# Patient Record
Sex: Male | Born: 1985 | Race: White | Hispanic: No | Marital: Married | Smoking: Former smoker
Health system: Southern US, Community
[De-identification: ages and names within clinical notes are randomized; demographics above are authoritative.]

## PROBLEM LIST (undated history)

## (undated) DIAGNOSIS — F411 Generalized anxiety disorder: Secondary | ICD-10-CM

## (undated) DIAGNOSIS — A048 Other specified bacterial intestinal infections: Secondary | ICD-10-CM

## (undated) DIAGNOSIS — I82409 Acute embolism and thrombosis of unspecified deep veins of unspecified lower extremity: Secondary | ICD-10-CM

## (undated) DIAGNOSIS — R7989 Other specified abnormal findings of blood chemistry: Secondary | ICD-10-CM

## (undated) DIAGNOSIS — K219 Gastro-esophageal reflux disease without esophagitis: Secondary | ICD-10-CM

## (undated) DIAGNOSIS — D751 Secondary polycythemia: Secondary | ICD-10-CM

## (undated) DIAGNOSIS — I2699 Other pulmonary embolism without acute cor pulmonale: Secondary | ICD-10-CM

## (undated) DIAGNOSIS — I10 Essential (primary) hypertension: Secondary | ICD-10-CM

## (undated) HISTORY — DX: Gastro-esophageal reflux disease without esophagitis: K21.9

## (undated) HISTORY — DX: Secondary polycythemia: D75.1

## (undated) HISTORY — DX: Other pulmonary embolism without acute cor pulmonale: I26.99

## (undated) HISTORY — DX: Essential (primary) hypertension: I10

## (undated) HISTORY — DX: Other specified bacterial intestinal infections: A04.8

## (undated) HISTORY — PX: TONSILLECTOMY: SUR1361

## (undated) HISTORY — DX: Other specified abnormal findings of blood chemistry: R79.89

## (undated) HISTORY — DX: Generalized anxiety disorder: F41.1

## (undated) HISTORY — PX: KNEE SURGERY: SHX244

## (undated) HISTORY — DX: Acute embolism and thrombosis of unspecified deep veins of unspecified lower extremity: I82.409

## (undated) HISTORY — PX: SHOULDER SURGERY: SHX246

---
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
## 2004-07-19 ENCOUNTER — Ambulatory Visit: Payer: Self-pay | Admitting: Family Medicine

## 2004-10-19 ENCOUNTER — Ambulatory Visit: Payer: Self-pay | Admitting: Family Medicine

## 2005-05-01 ENCOUNTER — Ambulatory Visit: Payer: Self-pay | Admitting: Family Medicine

## 2005-05-31 ENCOUNTER — Encounter: Admission: RE | Admit: 2005-05-31 | Discharge: 2005-08-29 | Payer: Self-pay | Admitting: Specialist

## 2005-08-20 ENCOUNTER — Ambulatory Visit: Payer: Self-pay | Admitting: Family Medicine

## 2006-03-15 ENCOUNTER — Ambulatory Visit: Payer: Self-pay | Admitting: Family Medicine

## 2006-07-01 ENCOUNTER — Ambulatory Visit: Payer: Self-pay | Admitting: Internal Medicine

## 2006-09-24 ENCOUNTER — Encounter: Payer: Self-pay | Admitting: Family Medicine

## 2006-09-24 DIAGNOSIS — G43009 Migraine without aura, not intractable, without status migrainosus: Secondary | ICD-10-CM | POA: Insufficient documentation

## 2006-09-25 ENCOUNTER — Ambulatory Visit: Payer: Self-pay | Admitting: Family Medicine

## 2006-09-25 DIAGNOSIS — R635 Abnormal weight gain: Secondary | ICD-10-CM | POA: Insufficient documentation

## 2006-09-25 DIAGNOSIS — I10 Essential (primary) hypertension: Secondary | ICD-10-CM

## 2006-10-25 ENCOUNTER — Telehealth: Payer: Self-pay | Admitting: Family Medicine

## 2007-09-16 ENCOUNTER — Ambulatory Visit: Payer: Self-pay | Admitting: Family Medicine

## 2007-09-19 ENCOUNTER — Encounter (INDEPENDENT_AMBULATORY_CARE_PROVIDER_SITE_OTHER): Payer: Self-pay | Admitting: *Deleted

## 2007-09-19 ENCOUNTER — Telehealth: Payer: Self-pay | Admitting: Family Medicine

## 2007-09-23 ENCOUNTER — Ambulatory Visit: Payer: Self-pay | Admitting: Orthopaedic Surgery

## 2008-02-17 ENCOUNTER — Ambulatory Visit: Payer: Self-pay | Admitting: Family Medicine

## 2008-02-17 DIAGNOSIS — K5289 Other specified noninfective gastroenteritis and colitis: Secondary | ICD-10-CM

## 2009-07-14 ENCOUNTER — Emergency Department: Payer: Self-pay | Admitting: Emergency Medicine

## 2009-11-11 ENCOUNTER — Ambulatory Visit: Payer: Self-pay | Admitting: General Practice

## 2009-11-21 ENCOUNTER — Encounter (INDEPENDENT_AMBULATORY_CARE_PROVIDER_SITE_OTHER): Payer: Self-pay | Admitting: *Deleted

## 2010-05-16 NOTE — Letter (Signed)
Summary: Nadara Eaton letter  [place] [facility]  [address] [place], [place] [address]   Phone: [phone]  Fax: [phone]       11/21/2009 MRN: [id number]  JAMONT MELLIN [address] [place], [place]  [address]  Dear Mr. Tarri Aberneth[facility], and [facility] announce the retirement of Arta Silence, M.D., from full-time practice at the [facility] office effective October 13, 2009 and his plans of returning part-time.  It is important to Dr. Hetty Ely and to our practice that you understand that [facility] has seven physicians in our office for your health care needs.  We will continue to offer the same exceptional care that you have today.    Dr. Hetty Ely has spoken to many of you about his plans for retirement and returning part-time in the fall.   We will continue to work with you through the transition to schedule appointments for you in the office and meet the high standards that [place] is committed to.   Again, it is with great pleasure that we share the news that Dr. Hetty Ely will return to [facility] in October of 2011 with a reduced schedule.    If you have any questions, or would like to request an appointment with one of our physicians, please call us at [phone] and press the option for Scheduling an appointment.  We take pleasure in providing you with excellent patient care and look forward to seeing you at your next office visit.  Our [facility] Physicians are:  Tillman Abide, M.D. [name], M.D. Roxy Manns, M.D. Kerby Nora, M.D. Hannah Beat, M.D. Ruthe Mannan, M.D. We proudly welcomed Raechel Ache, M.D. and Eustaquio Boyden, M.D. to the practice in July/August 2011.  Sincerely,  [facility]

## 2010-09-01 NOTE — Discharge Summary (Signed)
NAMEREIN, POPOV                         ACCOUNT NO.:  [id number]   MEDICAL RECORD NO.:  [id number]                   PATIENT TYPE:  INP   LOCATION:  6126                                 FACILITY:  [facility]   PHYSICIAN:  Rene Paci, M.D. [facility]          DATE OF BIRTH:  1985-09-03   DATE OF ADMISSION:  06/07/2003  DATE OF DISCHARGE:  06/08/2003                                 DISCHARGE SUMMARY   DISCHARGE DIAGNOSES:  1. Acute infectious mononucleosis.  2. Tonsillar hypertrophy.  3. Partial obstruction.  4. Mildly elevated transaminases.  5. Dehydration.   BRIEF ADMISSION HISTORY:  Mr. Piechota is an 25 year old white male who  developed upper respiratory infections over the past week.  He was seen in  the office on June 04, 2003 with severe back pain.  Over the past few  days, he has had worsening tonsillary swelling and pharyngeal pain.  His  pharyngeal pain worsened over the weekend.  He eventually went to the ER,  and was given IV fluids and IV morphine.  Laboratories revealed a positive  monospot with right shift.  The patient was unable to eat or drink secondary  to throat pain and swelling.   PAST MEDICAL HISTORY:  1. Status post appendectomy in August of 2003.  2. History of prostatitis.  3. History of wisdom teeth extractions.  4. History of multiple episodes of tonsillar swelling and pharyngitis.   HOSPITAL COURSE:  #1.  ENT.  The patient presented with acute infectious  mononucleosis with severe pharyngitis.  The patient was admitted for IV  hydration and steroids.  We also asked for ENT to see the patient.  The  patient was seen in consultation by Dr. Lazarus Salines, who concurred that the  patient had an acute infectious mononucleosis with tonsillary hypertrophy  and partial obstruction.  He saw no evidence of peritonsillar abscess or  bacterial infection.  He saw no indication for antibiotics.  The patient  received 3 doses of IV Solu-Medrol.  Dr. Lazarus Salines followed  up with the  patient on June 08, 2003, and felt that his tonsillary swelling was  improving.  He saw no indication for further steroids.  He concurred the  patient was stable for discharge home.  #2.  GI.  The patient had mildly elevated transaminases, again secondary to  his mononucleosis.  #3.  Dehydration.  This was treated with IV fluids.  Currently, the patient  is able to swallow both solids and liquids.   MEDICATIONS AT DISCHARGE:  1. Roxicet 5/325 per 5 ml solution, 5-10 ml q.4-6h. p.r.n.  2. Chloraseptic p.r.n.  3. Tylenol p.r.n.   FOLLOW UP:  1. Follow up with Dr. Hetty Ely on Friday, June 11, 2003, at 12:15 p.m.  2. Follow up with Dr. Lazarus Salines in 4-6 weeks as needed.      Cornell Barman, P.A. [facility]  Rene Paci, M.D. [facility]    LC/MEDQ  D:  06/08/2003  T:  06/09/2003  Job:  [id number]   cc:   Gloris Manchester. Lazarus Salines, M.D.  [address] [place]  [place] [address]  Fax: [phone]   [name], M.D.  [address] [place]  [place] [address]  Fax: [phone]

## 2010-09-01 NOTE — Consult Note (Signed)
Edward [name], Edward [name]                         ACCOUNT NO.:  [id number]   MEDICAL RECORD NO.:  [id number]                   PATIENT TYPE:  INP   LOCATION:  6126                                 FACILITY:  [facility]   PHYSICIAN:  [name], M.D.              DATE OF BIRTH:  Apr 21, 1985   DATE OF CONSULTATION:  06/07/2003  DATE OF DISCHARGE:                                   CONSULTATION   CHIEF COMPLAINT:  Severe sore throat.   HISTORY OF PRESENT ILLNESS:  An 25 year old white male hospitalized for  dehydration.  He has had malaise and some degree of sore throat for a couple  weeks, getting rapidly worse over the past 2 or 3 days.  He was seen in the  emergency room this weekend, diagnosed with mononucleosis based on serology,  and sent home.  He has been unable to drink and was admitted to the hospital  for fluid support and further evaluation.  On admission he was noted to have  severe tonsillar hypertrophy with some difficulty breathing, painful  difficulty swallowing, and fevers.  In the hospital he is receiving  intravenous hydration and received a first dose of Solu-Medrol 125 mg one-  and-one-half hours ago.  He is getting intravenous morphine sulfate for pain  relief.   Even prior to this particular episode he has been noted to have large  tonsils for some time.  Also, at least three episodes per year of  tonsillitis and strep throat and heavy snoring.  I could not get an adequate  history of sleep apnea today.  He had a severe episode of tonsillitis back  in October 2004.  He has obviously never had his tonsils removed or  recommended for this.  He is otherwise healthy.   PHYSICAL EXAMINATION:  This is a large-framed and obese young adult white  male.  He has minimal fetor oris.  He has a severe hot potato voice.  He is  breathing comfortably through his mouth.  He is mildly warm to touch and  slightly diaphoretic.  The head is atraumatic and neck supple.  Ear canals  are  clear with normal aerated drums.  Anterior nose is clear.  Oral cavity  reveals 4+ tonsils without exudate.  There is erythema over the tonsils and  overall the oral cavity and pharynx is some dry.  There is slight asymmetric  erythema over the left soft palate but the area is not firm, swollen, or  asymmetric.  I really could not even see the uvula.  Did not examine  nasopharynx or hypopharynx.  Neck is bulky with some adenopathy.   IMPRESSION:  Infectious mononucleosis with tonsillar hypertrophy and partial  obstruction.  No evidence of a peritonsillar abscess or bacterial infection.  Premorbid recurrent tonsillitis and possible sleep apnea.   PLAN:  I agree with IV hydration, IV Solu-Medrol.  Sometimes the response to  steroids can  be rapid and quite dramatic.  I do not think he has a  significant airway threat.  I do not think he needs oral or intravenous  antibiotics at this point.  I would continue with hydration and analgesics.  I have written for some oral analgesics for longer term,  less fluctuating pain control.  I will follow him in the hospital with you.  I talked to the family briefly about the need for possible tonsillectomy  when this is all resolved.  We will plan on seeing them back in the office 4-  6 weeks after hospital discharge.  They understand and agree.                                               Gloris Manchester. Lazarus Salines, M.D.    KTW/MEDQ  D:  06/07/2003  T:  06/08/2003  Job:  [id number]   cc:   Rene Paci, M.D. [facility]  [address] [place], [place] [address]

## 2010-09-01 NOTE — H&P (Signed)
Edward [name], TRAIN                         ACCOUNT NO.:  [id number]   MEDICAL RECORD NO.:  [id number]                   PATIENT TYPE:  OUT   LOCATION:  XRAY                                 FACILITY:  [facility]   PHYSICIAN:  Adolph Pollack, M.D.            DATE OF BIRTH:  Mar 25, 1986   DATE OF ADMISSION:  11/21/2001  DATE OF DISCHARGE:  11/21/2001                                HISTORY & PHYSICAL   CHIEF COMPLAINT:  Abdominal pain.   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  He saw Dr.  Hetty Ely today, and he was sent for a CT scan at [facility] which  demonstrated findings consistent with acute appendicitis.  At that point, he  was sent to the emergency department and I was asked to see him.  He says  that he has had some subjective fever.  He had some diarrhea after drinking  oral contrast.  No dysuria.  He has not eaten anything since yesterday.   PAST MEDICAL HISTORY:  Prostatitis.   PAST SURGICAL HISTORY:  Wisdom tooth extraction.   ALLERGIES:  No known drug allergies.   MEDICATIONS:  None.   SOCIAL HISTORY:  No tobacco or alcohol use.  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   FAMILY HISTORY:  Positive for hypertension in his mother and diabetes in his  father.   REVIEW OF SYMPTOMS:  CARDIOVASCULAR:  No known heart disease, hypertension.  PULMONARY:  No asthma, pneumonia.  GASTROINTESTINAL:  No hepatitis, peptic ulcer disease.  GENITOURINARY:  No kidney stones.  HEMATOLOGIC:  No bleeding disorders or blood clots.  NEUROLOGIC:  No childhood seizures.   PHYSICAL EXAMINATION:  GENERAL:  A well-developed, well-nourished male in no  acute distress.  Pleasant and cooperative.  VITAL SIGNS:  Temperature  97.3, blood pressure 125/72, pulse 61.  HEENT:  Extraocular movements were intact.  No icterus.  SKIN:  No jaundice.  NECK:  Supple without palpable masses.  CARDIOVASCULAR:  Heart demonstrates regular rate and rhythm, no murmur.  RESPIRATORY:  Breath sounds equal.  Respirations non labored.  ABDOMEN:  Soft, with right lower quadrant tenderness and guarding to both  auscultation and percussion.  Positive Rovsing, obturator, and psoas signs  noted.  EXTREMITIES:  Full range of motion, no cyanosis or edema.   IMPRESSION:  Acute appendicitis, not perforated on CT scan.   PLAN:  Laparoscopic appendectomy.  I did explain the procedure and risks to  him and his parents.  The risks include, but are not limited to bleeding,  infection, anesthetic risks, accidental damage to the intra-abdominal organs  including intestine, bladder.  They seem to understand this and agree  to  proceed.                                                Adolph Pollack, M.D.    Kari Baars  D:  11/21/2001  T:  11/23/2001  Job:  [id number]

## 2010-09-01 NOTE — Op Note (Signed)
NAMEMARCKUS, [name]                         ACCOUNT NO.:  [id number]   MEDICAL RECORD NO.:  [id number]                   PATIENT TYPE:  OIB   LOCATION:  3316                                 FACILITY:  [facility]   PHYSICIAN:  Edward [name], M.D.              DATE OF BIRTH:  12/18/1985   DATE OF PROCEDURE:  09/03/2003  DATE OF DISCHARGE:  09/04/2003                                 OPERATIVE REPORT   PREOPERATIVE DIAGNOSIS:  Obstructive adenotonsillar hypertrophy with sleep  apnea.   POSTOPERATIVE DIAGNOSIS:  Obstructive adenotonsillar hypertrophy with sleep  apnea.   PROCEDURE PERFORMED:  Tonsillectomy, adenoid ablation.   SURGEON:  Edward [name]. Edward [name], M.D.   ANESTHESIA:  General orotracheal.   ESTIMATED BLOOD LOSS:  Less than 25 mL.   COMPLICATIONS:  None.   FINDINGS:  4+, extremely bulky tonsils with moderate fibrosis.  A long,  thick central uvula.  Moderate adenoid pad with a fleshy nasopharynx and  oropharynx.   PROCEDURE:  With the patient in a comfortable supine position, general  orotracheal anesthesia was induced without difficulty.  At an appropriate  level, the table was turned 90 degrees.  In the process of doing this, it  was observed that there was a substantial air leak and the tube was felt to  have become dislodged.  Dr. Lazarus [name] reintubated the patient using a  Macintosh blade without difficulty.  Satisfactory ventilation was  accomplished from this point forward through the orotracheal tube.   Taking care to protect lips, teeth, and endotracheal tube, the Crowe-Davis  mouth gag was introduced, expanded for visualization, and suspended from the  Mayo stand in the standard fashion.  The findings were as described above.  Palate retractor and mirror were used to evaluate the nasopharynx, but this  was difficult owing to the bulk of the tonsils.  Xylocaine 0.5% with  1:200,000 epinephrine, 10 mL total, was infiltrated into the peritonsillar  planes for  intraoperative hemostasis.  Several minutes were allowed for this  to take effect.   At an appropriate level, the right tonsil was grasped and retracted  medially.  The mucosa overlying the anterior and superior poles was  coagulated and then cut down to the capsule of the tonsil.  Using the  cautery tip as a blunt dissector, but mostly lysing fibrous bands and  coagulating several robust crossing vessels, the tonsil was dissected free  of its muscular fossa from superiorly downward and front to back.  The  tonsil was removed in its entirety as determined by examination of both  tonsil and fossa.  A small additional quantity of cautery rendered the fossa  hemostatic.  After completing the right tonsillectomy, there was more  working room and the left tonsillectomy was performed in the same fashion.  There was a very brisk bleeding vessel at the inferior pole of the left  tonsil, which was grasped with a hemostat and coagulated.  There was mild  oozing from the base of tongue region, which was controlled with suction  cautery.   After completing both tonsillectomies and rendering the oropharynx  hemostatic, the palate retractor and mirror were used to visualize the  nasopharynx once again.  This was still difficult owing to the thick, fleshy  nature of the soft palate.  The uvula was grasped with the pickups and the  posterior surface was gently coagulated to reduce its overall bulk.   A red rubber catheter was passed through the nose and out the mouth to serve  as a better palate retractor.  It did appear that there was substantial  adenoid bulk up toward the choana.  A second catheter was placed through the  left nostril so that the palate was retracted on both sides.  Using suction  cautery, the adenoid was ablated mostly at the choanal level.  This was done  without any significant bleeding.  Upon achieving hemostasis of the  nasopharynx, the oropharynx was again observed to be  hemostatic.  The  pharynx was rinsed and carefully suctioned clean.  At this point the palate  retractor and the mouth gag were relaxed for several minutes.  Upon re-  expansion, hemostasis was persistent.  At this point the procedure was  completed.  The palate retractors and mouth gag were relaxed and removed.  The dental status was intact.  The patient was returned to anesthesia,  awakened, extubated, and transferred to recovery in stable condition.   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  Has had a multiple-year history of large tonsils, snoring, and breathing  difficulty during sleep, hence the indication for today's procedure.  Anticipate a routine postoperative recovery with at least 24-hour  observation given his sleep apnea in one of the step-down units with careful  attention to his narcotics and to his oxygen saturation.                                               Edward [name]. Edward [name], M.D.    KTW/MEDQ  D:  09/03/2003  T:  09/04/2003  Job:  [id number]   cc:   Edward [name], M.D.  [address] [place]  [place] [address]  Fax: [phone]

## 2010-09-01 NOTE — Op Note (Signed)
TNAMEMORDECHAI, MATUSZAK                        ACCOUNT NO.:  [id number]   MEDICAL RECORD NO.:  [id number]                   PATIENT TYPE:  INP   LOCATION:  0346                                 FACILITY:  [facility]   PHYSICIAN:  Adolph Pollack, M.D.            DATE OF BIRTH:  June 21, 1985   DATE OF PROCEDURE:  DATE OF DISCHARGE:  11/22/2001                                 OPERATIVE REPORT   PREOPERATIVE DIAGNOSIS:  Acute appendicitis.   POSTOPERATIVE DIAGNOSIS:  Acute appendicitis.   PROCEDURE:  Laparoscopic appendectomy.   SURGEON:  Adolph Pollack, M.D.   ANESTHESIA:  General.   INDICATIONS FOR PROCEDURE:  Mr. Winkles is a 25 year old male who began having  abdominal pain in the epigastric region following football practice that  eventually radiated down to the right lower quadrant. He was seen by Dr.  Hetty Ely and sent to the [facility] where a CT scan demonstrated  findings consistent with acute appendicitis. He is now brought to the  operating room for appendectomy.  The procedure and risks were explained to  him, his mother and his father preoperatively.   TECHNIQUE:  He was placed supine on the operating table and a general  anesthetic was administered. A Foley catheter was placed in his bladder. His  abdomen was shaved and sterilely prepped and draped. A small subumbilical  incision was made incising the skin sharply. The subcutaneous tissue was  separately bluntly. The midline fascia was identified and a small incision  made in it. A small incision was made in the peritoneum and the peritoneal  cavity was entered bluntly and under direct vision. A pursestring suture of  #0 Vicryl was placed around the fascial edges. A Hasson trocar was  introduced into the peritoneal cavity and pneumoperitoneum was created by  insufflation of CO2 gas. Next, the laparoscope was introduced.   Of note was that he had adhesions between the sigmoid colon and anterior  abdominal  wall. The appendix was acutely inflamed. A 5 mm trocar was placed  in the lower abdomen and one in the right upper quadrant. The appendix was  retrocecal. I began by mobilizing the appendix from its lateral and  retrocecal attachments using the harmonic scalpel. The mesoappendix was then  divided and the appendiceal artery was divided using the harmonic scalpel  and a clip placed on it as well. Once I completely mobilized the appendix  and divided its mesoappendix, I then amputated the appendix of the cecum  with the EndoGIA stapler. Upon inspecting the staple line, it was  hemostatic.   I placed the appendix in an Endopouch bag and then removed it through the  subumbilical port. I reinserted the subumbilical port and copiously  irrigated out the abdominal cavity with 1 liter of normal saline. I  evacuated as much fluid as possible. I once again inspected the staple line  and it was solid and hemostatic. I  evacuated as much fluid as possible.  Under direct vision, I removed the lower abdominal 5 mm trocar and then  closed the fascial defect under laparoscopic vision by tightening up and  tying down the pursestring suture in the subumbilical region. I then  released the pneumoperitoneum with the final 5 mm trocar.   Next, I closed the skin incisions with 4-0 Monocryl subcuticular stitches.  Steri-Strips and sterile dressings were applied.   The patient tolerated the procedure well without any apparent complications.  He subsequently was taken to the recovery room in satisfactory condition.                                               Adolph Pollack, M.D.    Kari Baars  D:  11/21/2001  T:  11/23/2001  Job:  [id number]   cc:   [name], M.D.

## 2011-12-06 ENCOUNTER — Emergency Department: Payer: Self-pay | Admitting: Emergency Medicine

## 2011-12-18 ENCOUNTER — Ambulatory Visit: Payer: Self-pay | Admitting: Specialist

## 2012-01-04 ENCOUNTER — Ambulatory Visit: Payer: Self-pay | Admitting: Orthopedic Surgery

## 2012-01-16 ENCOUNTER — Ambulatory Visit: Payer: Self-pay | Admitting: Orthopedic Surgery

## 2012-01-16 LAB — BASIC METABOLIC PANEL
BUN: 8 mg/dL (ref 7–18)
Chloride: 106 mmol/L (ref 98–107)
EGFR (Non-African Amer.): >60
Glucose: 83 mg/dL (ref 65–99)
Potassium: 3.4 mmol/L — ABNORMAL LOW (ref 3.5–5.1)
Sodium: 143 mmol/L (ref 136–145)

## 2012-01-16 LAB — CBC
HGB: 16.5 g/dL (ref 13.0–18.0)
MCH: 30.9 pg (ref 26.0–34.0)
MCHC: 33.6 g/dL (ref 32.0–36.0)
Platelet: 230 10*3/uL (ref 150–440)

## 2012-01-16 LAB — APTT: Activated PTT: 27.4 secs (ref 23.6–35.9)

## 2012-01-16 LAB — PROTIME-INR: Prothrombin Time: 12.8 secs (ref 11.5–14.7)

## 2012-01-16 LAB — SEDIMENTATION RATE: Erythrocyte Sed Rate: 1 mm/hr (ref 0–15)

## 2012-01-23 ENCOUNTER — Ambulatory Visit: Payer: Self-pay | Admitting: Orthopedic Surgery

## 2012-04-16 HISTORY — PX: ELBOW SURGERY: SHX618

## 2013-02-03 ENCOUNTER — Ambulatory Visit: Payer: Self-pay | Admitting: Orthopedic Surgery

## 2013-04-14 ENCOUNTER — Ambulatory Visit: Payer: Self-pay | Admitting: Orthopedic Surgery

## 2013-04-14 LAB — BASIC METABOLIC PANEL
Anion Gap: 2 — ABNORMAL LOW (ref 7–16)
BUN: 8 mg/dL (ref 7–18)
Calcium, Total: 9.1 mg/dL (ref 8.5–10.1)
Co2: 29 mmol/L (ref 21–32)
Creatinine: 0.8 mg/dL (ref 0.60–1.30)
EGFR (African American): >60
EGFR (Non-African Amer.): >60
Glucose: 88 mg/dL (ref 65–99)
Osmolality: 270 (ref 275–301)
Potassium: 3.9 mmol/L (ref 3.5–5.1)

## 2013-04-14 LAB — URINALYSIS, COMPLETE
Bacteria: NONE SEEN
Glucose,UR: NEGATIVE mg/dL (ref 0–75)
Leukocyte Esterase: NEGATIVE
RBC,UR: 1 /HPF (ref 0–5)
Squamous Epithelial: <1
WBC UR: NONE SEEN /HPF (ref 0–5)

## 2013-04-14 LAB — CBC
HCT: 50.7 % (ref 40.0–52.0)
MCH: 32.3 pg (ref 26.0–34.0)
MCHC: 34.1 g/dL (ref 32.0–36.0)
RBC: 5.36 10*6/uL (ref 4.40–5.90)

## 2013-04-14 LAB — APTT: Activated PTT: 26.8 secs (ref 23.6–35.9)

## 2013-04-22 ENCOUNTER — Ambulatory Visit: Payer: Self-pay | Admitting: Orthopedic Surgery

## 2013-04-24 ENCOUNTER — Ambulatory Visit: Payer: Self-pay | Admitting: General Practice

## 2013-09-14 ENCOUNTER — Ambulatory Visit: Payer: Self-pay | Admitting: Orthopedic Surgery

## 2013-11-06 ENCOUNTER — Observation Stay: Payer: Self-pay | Admitting: Internal Medicine

## 2013-11-06 LAB — CBC
HCT: 61.3 % — AB (ref 40.0–52.0)
HGB: 20.3 g/dL — AB (ref 13.0–18.0)
MCH: 31.4 pg (ref 26.0–34.0)
MCHC: 33.1 g/dL (ref 32.0–36.0)
MCV: 95 fL (ref 80–100)
Platelet: 294 10*3/uL (ref 150–440)
RBC: 6.47 10*6/uL — ABNORMAL HIGH (ref 4.40–5.90)
RDW: 13.6 % (ref 11.5–14.5)
WBC: 13.4 10*3/uL — AB (ref 3.8–10.6)

## 2013-11-06 LAB — COMPREHENSIVE METABOLIC PANEL
ALBUMIN: 3.6 g/dL (ref 3.4–5.0)
ALK PHOS: 83 U/L
ANION GAP: 8 (ref 7–16)
BUN: 8 mg/dL (ref 7–18)
Bilirubin,Total: 0.3 mg/dL (ref 0.2–1.0)
CHLORIDE: 101 mmol/L (ref 98–107)
Calcium, Total: 8.9 mg/dL (ref 8.5–10.1)
Co2: 28 mmol/L (ref 21–32)
Creatinine: 1.14 mg/dL (ref 0.60–1.30)
Glucose: 116 mg/dL — ABNORMAL HIGH (ref 65–99)
OSMOLALITY: 273 (ref 275–301)
Potassium: 3.3 mmol/L — ABNORMAL LOW (ref 3.5–5.1)
SGOT(AST): 42 U/L — ABNORMAL HIGH (ref 15–37)
SGPT (ALT): 57 U/L
Sodium: 137 mmol/L (ref 136–145)
TOTAL PROTEIN: 8.3 g/dL — AB (ref 6.4–8.2)

## 2013-11-06 LAB — DRUG SCREEN, URINE

## 2013-11-06 LAB — URINALYSIS, COMPLETE
BACTERIA: NONE SEEN
BILIRUBIN, UR: NEGATIVE
BLOOD: NEGATIVE
Glucose,UR: NEGATIVE mg/dL (ref 0–75)
Ketone: NEGATIVE
LEUKOCYTE ESTERASE: NEGATIVE
Nitrite: NEGATIVE
PH: 6 (ref 4.5–8.0)
RBC,UR: 9 /HPF (ref 0–5)
Specific Gravity: 1.024 (ref 1.003–1.030)
Squamous Epithelial: NONE SEEN
WBC UR: 1 /HPF (ref 0–5)

## 2013-11-06 LAB — APTT: ACTIVATED PTT: 28.6 s (ref 23.6–35.9)

## 2013-11-06 LAB — CK TOTAL AND CKMB (NOT AT ARMC)
CK, TOTAL: 480 U/L — AB
CK, Total: 302 U/L
CK, Total: 412 U/L — ABNORMAL HIGH
CK-MB: 1.5 ng/mL (ref 0.5–3.6)
CK-MB: 1.5 ng/mL (ref 0.5–3.6)
CK-MB: 1.7 ng/mL (ref 0.5–3.6)

## 2013-11-06 LAB — TROPONIN I
Troponin-I: <0.02 ng/mL
Troponin-I: <0.02 ng/mL

## 2013-11-06 LAB — PROTIME-INR
INR: 0.9
Prothrombin Time: 12.5 secs (ref 11.5–14.7)

## 2013-11-07 LAB — CBC WITH DIFFERENTIAL/PLATELET
Basophil #: 0.1 10*3/uL (ref 0.0–0.1)
Basophil %: 0.7 %
Eosinophil #: 0.1 10*3/uL (ref 0.0–0.7)
Eosinophil %: 0.9 %
HCT: 59 % — ABNORMAL HIGH (ref 40.0–52.0)
HGB: 19.2 g/dL — ABNORMAL HIGH (ref 13.0–18.0)
LYMPHS PCT: 24.6 %
Lymphocyte #: 3.7 10*3/uL — ABNORMAL HIGH (ref 1.0–3.6)
MCH: 31.1 pg (ref 26.0–34.0)
MCHC: 32.5 g/dL (ref 32.0–36.0)
MCV: 96 fL (ref 80–100)
Monocyte #: 1 x10 3/mm (ref 0.2–1.0)
Monocyte %: 6.8 %
NEUTROS PCT: 67 %
Neutrophil #: 10.2 10*3/uL — ABNORMAL HIGH (ref 1.4–6.5)
Platelet: 261 10*3/uL (ref 150–440)
RBC: 6.16 10*6/uL — ABNORMAL HIGH (ref 4.40–5.90)
RDW: 13.7 % (ref 11.5–14.5)
WBC: 15.2 10*3/uL — ABNORMAL HIGH (ref 3.8–10.6)

## 2013-11-07 LAB — BASIC METABOLIC PANEL
ANION GAP: 6 — AB (ref 7–16)
BUN: 8 mg/dL (ref 7–18)
CREATININE: 0.94 mg/dL (ref 0.60–1.30)
Calcium, Total: 8.2 mg/dL — ABNORMAL LOW (ref 8.5–10.1)
Chloride: 103 mmol/L (ref 98–107)
Co2: 30 mmol/L (ref 21–32)
EGFR (African American): >60
EGFR (Non-African Amer.): >60
GLUCOSE: 91 mg/dL (ref 65–99)
Osmolality: 275 (ref 275–301)
POTASSIUM: 3.6 mmol/L (ref 3.5–5.1)
SODIUM: 139 mmol/L (ref 136–145)

## 2013-11-07 LAB — HEPARIN LEVEL (UNFRACTIONATED)

## 2013-11-09 ENCOUNTER — Ambulatory Visit: Payer: Self-pay | Admitting: General Practice

## 2013-11-10 ENCOUNTER — Ambulatory Visit: Payer: Self-pay | Admitting: Internal Medicine

## 2013-11-11 DIAGNOSIS — E785 Hyperlipidemia, unspecified: Secondary | ICD-10-CM | POA: Insufficient documentation

## 2013-11-11 DIAGNOSIS — R079 Chest pain, unspecified: Secondary | ICD-10-CM

## 2013-11-11 HISTORY — DX: Chest pain, unspecified: R07.9

## 2013-11-14 ENCOUNTER — Ambulatory Visit: Payer: Self-pay | Admitting: Internal Medicine

## 2013-12-15 ENCOUNTER — Ambulatory Visit: Payer: Self-pay | Admitting: Internal Medicine

## 2014-01-14 ENCOUNTER — Ambulatory Visit: Payer: Self-pay | Admitting: Internal Medicine

## 2014-02-14 ENCOUNTER — Ambulatory Visit: Payer: Self-pay | Admitting: Internal Medicine

## 2014-03-16 ENCOUNTER — Ambulatory Visit: Payer: Self-pay | Admitting: Internal Medicine

## 2014-04-16 ENCOUNTER — Ambulatory Visit: Payer: Self-pay | Admitting: Internal Medicine

## 2014-05-17 ENCOUNTER — Ambulatory Visit: Payer: Self-pay | Admitting: Internal Medicine

## 2014-06-15 ENCOUNTER — Ambulatory Visit
Admit: 2014-06-15 | Disposition: A | Discharge status: Home / Self Care | Payer: Self-pay | Attending: Internal Medicine | Admitting: Internal Medicine

## 2014-08-07 NOTE — Consult Note (Signed)
PATIENT NAME:  Edward Day, Edward Day MR#:  [id number] DATE OF BIRTH:  02-Aug-1985  CARDIOLOGY CONSULTATION   DATE OF CONSULTATION:  11/07/2013  CONSULTING PHYSICIAN:  Dionisio David, MD  INDICATION FOR THE CONSULTATION: Chest pain   HISTORY OF PRESENT ILLNESS: This is a 29 year old white male with a past medical history of hypertension, hyperlipidemia, who presented to the Emergency Room last night with chest pain. The chest pain was described as pressure-type, retrosternal area, associated with shortness of breath. The patient apparently ruled out for myocardial infarction, just had a stress test done and result is still pending. The patient denies any chest pain right now.   PAST MEDICAL HISTORY: Hypertension, hyperlipidemia, history of right hand surgery, appendectomy, left elbow surgery, left shoulder surgery.   ALLERGIES: BANANA.   MEDICATIONS:  1. Ventolin inhaler. 2. Testosterone.  3. Simvastatin 20 mg.  4. Hydrochlorothiazide/lisinopril 25/20.   SOCIAL HISTORY: He drinks about 2 to 3 beers daily. Also smokes 2 packs per day for 15 years.   FAMILY HISTORY: Positive for coronary artery disease, hypertension and diabetes.   PHYSICAL EXAMINATION:  GENERAL: He is alert and oriented x3, in no acute distress.  VITAL SIGNS: His blood pressure is 110/67, respirations 18, pulse 86, saturation is 96.  NECK: Revealed no JVD.  LUNGS: Clear.  HEART: Regular rate and rhythm. Normal S1, S2. No audible murmur. Chest has no tenderness.  ABDOMEN: Soft, nontender, positive bowel sounds.  EXTREMITIES: No pedal edema.  NEUROLOGICAL: Appears to be intact.   DIAGNOSTIC STUDIES: His cardiac enzymes x3 were done; apparently, are all negative. His tox screen was also negative. He is a little hypoxic.Marland Kitchen His PO2 was 62 and pCO2 was 46. His hemoglobin is also very high at 19.2, and white count is slightly elevated at 15.2.   ASSESSMENT AND PLAN: The patient has chest pain. Etiology is unclear. He has risk  factors of hypertension, hyperlipidemia and smoking, but is only 28. His stress test is pending, also echocardiogram is pending. Myocardial infarction has been ruled out. Probably, once the stress test result is available, he can be discharged with a followup in the office.   Thank you very much for referral.   ____________________________ Dionisio David, MD sak:lb D: 11/07/2013 10:56:36 ET T: 11/07/2013 11:18:41 ET JOB#: [id number]  cc: Dionisio David, MD, <Dictator> Dionisio David MD ELECTRONICALLY SIGNED 11/19/2013 12:02

## 2014-08-07 NOTE — Op Note (Signed)
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   PROCEDURE: Left shoulder arthroscopy with subacromial decompression, extensive subacromial debridement, debridement of the superior labrum, chondroplasty of the humeral head, distal clavicle excision and arthroscopic biceps tenotomy.   ANESTHESIA: General with left interscalene block along with 1% lidocaine plain for local injection of the arthroscopy incisions and 0.25% Marcaine plain for subacromial and intra-articular injection at the conclusion of the case.   SURGEON: Edward Day, M.D.   ESTIMATED BLOOD LOSS: Minimal.   COMPLICATIONS: None.   INDICATIONS FOR PROCEDURE: Edward Day is a 29 year old male who has had persistent anterolateral shoulder pain. He works in the [employer] and [employer]. He initially underwent a successful left shoulder Bankart and SLAP repair on 01/23/2012. The patient initially did well postop but had developed increasing anterolateral shoulder pain, particularly with abduction and overhead activity. The patient has failed all conservative management including nonsteroidal anti-inflammatories, physical therapy, corticosteroid injection of both the biceps tendon sheath and subacromial space. A repeat MRI was performed showing that the repair of the labrum was intact but the patient had inflammation, possible partial tear of the rotator cuff and possible biceps tendinitis. Given that he had failed all nonoperative management, the decision was made to perform a shoulder arthroscopy to confirm the pathology perform and to perform any repair or debridement that was necessary. I  reviewed the risks and benefits of surgery with the patient in my office prior to the day of surgery. He understands that the risks include infection, bleeding, nerve or blood vessel injury, especially injury to the axillary nerve which may lead to permanent numbness or weakness, persistent left shoulder pain, shoulder stiffness and the need for further surgery. The patient understood that there is a possibility of a "Popeye" deformity of the biceps following tenolysis. He may require further surgery if he continues to have shoulder pain. He also understands the medical risks include, but are not limited to, DVT and pulmonary embolism, myocardial infarction, stroke, pneumonia, respiratory failure and death. The patient understood these risks and wished to proceed.   PROCEDURE: The patient was met in the preoperative area. I signed the left shoulder according to the hospital's right side protocol. I updated the patient's history and physical. He underwent an interscalene block in the preoperative area. He was then brought to the operating room where he was placed in a beach chair position. A spider arm holder was used for this case.    A timeout was performed to verify the patient's name, date of birth, medical record number, correct site of surgery, and correct procedure to be performed. It was also used to verify the patient had received antibiotics and that all appropriate instruments, implants, and radiographic studies were available in the room. Once all in attendance were in agreement, the case began.   An examination under anesthesia revealed full passive shoulder range of motion and no instability to load and shift testing, and he had a negative sulcus sign.   The bony landmarks were drawn out with a surgical marker along with the proposed arthroscopy incisions. These were pre-injected with 1% lidocaine plain. An 11 blade was used to then establish a posterior portal. The arthroscope was placed into  the  glenohumeral joint. An 18-gauge spinal needle was used to create the anterior portal under direct visualization. A 5.75 mm arthroscopic cannula was then placed through the anterior portal. A full diagnostic examination of the glenohumeral joint was then performed.   Findings on arthroscopy included fraying of the superior labrum. One of the sutures of the  previous SLAP repair was slightly frayed and therefore was debrided. The superior labrum appeared to be intact but frayed. A 4-0 resector shaver blade was used to debride the fraying of the superior labrum. A hook probe was used to probe the superior labrum and anterior labrum. Both repairs were intact. The patient's biceps tendon attachment to the superior labrum was intact but encased in scar tissue. There was significant synovitis surrounding the biceps tendon involving his intra-articular and intertuberous portions of the biceps. There was hyperemia of the biceps tendon as well consistent with inflammation. There were areas of yellowish discoloration, consistent with degenerative changes as well. Based on these findings, an attempted lysis of adhesions of the biceps tendon was performed using a 4-0 resector shaver blade. However, the scar was too dense and it was not possible to free the tendon from all adhesions and scar. The decision was therefore made based on the degenerative changes, the tenosynovitis and the lysis of adhesions to the perform a tenotomy. The arthroscopic scissor was used to perform biceps tenotomy. Prior to the tenotomy, the egress of the biceps tendon into the intertuberous groove was also very patulous.  The biceps tendon sling appeared to be torn. There was thickening of the subscapularis but no obvious tear to the superior aspect of the supraspinatus seen on his arthroscopy today. The patient had intact rotator cuff without fraying or tears. There was some fraying of the cartilage of the humeral head. Chondroplasty was performed  to remove all loose pieces of cartilage. The articular face of the glenoid also had some chondromalacia as well. There was some degenerative changes at the inferior margin of the humeral head and evidence of a prior Hill-Sachs lesion which was non-engaging. The inferior recess showed no loose bodies or HAGL lesion. The anterior and inferior labrum repairs were intact. The anchors were in place and there is no evidence of hardware failure. After the glenohumeral joint was addressed, final images were taken. The arthroscope was then removed and placed into the subacromial space. Upon entry of the arthroscope into the subacromial space, it was obvious there was dense and abundant scarring and bursitis. A lateral portal was then created using an 18-gauge spinal needle. The 4-0 resector shaver blade was placed through the lateral portal, and an extensive bursectomy was performed using not only the 4-0 resector shaver blade but a 90 degree ArthroCare wand. Once adequate visualization of the subacromial space was performed, the subacromial spur and AC joint arthrosis were appreciated. The rotator cuff was inspected and there was no bursal-sided tear. A 5.5 mm resector shaver blade was then placed through the lateral portal and a subacromial decompression was performed. The shaver blade was then placed through the anterior portal and a distal clavicle excision was performed as well. After extensive debridement in the subacromial space, the subacromial space was copiously irrigated. All arthroscopic instruments were removed after final images were taken of the subacromial decompression and distal clavicle excision. A 4-0 nylon suture was used to close all three arthroscopy portals. Steri-Strips were applied along with a dry sterile dressing. TENS unit pads, as well as a Polar Care sleeve and a regular sling  were placed on the patient. He was then awakened and brought to the PACU in stable condition. I was scrubbed and  present for the entire case and all sharp and instrument counts were correct at the conclusion of the case. I spoke with the patient's mother in the postoperative consultation room to let her know the case had gone without complication. The patient was stable in the recovery room.    ____________________________ Timoteo Gaul, MD klk:dp D: 05/01/2013 13:14:44 ET T: 05/01/2013 13:27:02 ET JOB#: [id number]  cc: Timoteo Gaul, MD, <Dictator> Timoteo Gaul MD ELECTRONICALLY SIGNED 05/08/2013 14:04

## 2014-08-07 NOTE — Discharge Summary (Signed)
PATIENT NAME:  Edward Day, PAUWELS MR#:  [id number] DATE OF BIRTH:  1985-12-31  DATE OF ADMISSION:  11/06/2013 DATE OF DISCHARGE:  11/07/2013  ADMISSION DIAGNOSES:  1.  Chest pain.  2.  Polycythemia.  DISCHARGE DIAGNOSES:  1.  Chest pain, not cardiac in nature.  2.  Polycythemia.  3.  Hypertension.   CONSULTATIONS: Dr. Humphrey Rolls.   IMAGING: The patient underwent a Myoview stress test which essentially was negative. A 2-D echocardiogram showed normal ejection fraction. He did have impaired relaxation pattern of LV diastolic filling.   PERTINENT LABORATORIES: Sodium 139, potassium 2.6, chloride 103, bicarbonate 30, BUN 8, creatinine 0.94, glucose 91. Troponins x 3 are negative. White blood cells is 15, hemoglobin 19.2, hematocrit 59, platelets are 261,000.   HOSPITAL COURSE: This is a 29 year old male who presented chest pain. For further details, please refer to the H and P.   1.  Chest pain.  The patient was admitted to telemetry as cardiac enzymes were negative. His telemetry was negative. EKG showed no acute changes. He underwent an echocardiogram as well as stress test which essentially was negative stress test.  His echocardiogram does show some diastolic dysfunction, probably from his uncontrolled blood pressure and possible  obstructive sleep apnea. His chest pain was not at all suggestive of acute coronary syndrome nor was it suggestive of GERD. Dr. Humphrey Rolls was consulted. Consult was appreciated.   2.  Polycythemia. The patient's mother has a history of polycythemia,  recently diagnosed. The patient is also on testosterone, which can cause polycythemia. Testosterone is being held which I discussed with the patient, which may be an issue as he has been on it for 4 years and apparently it has helping him with his mood and his energy. I told him to please discuss this with his primary care physician, but they may want to stop the testosterone and see what his cell count does in the meantime.  He also  did not want to wait for the oncologist here, so his mom goes to an oncologist in [place] and he will be referred there.   3.  Tobacco dependence. The patient was encouraged to stop smoking. The patient was counseled for 3 minutes regarding this and will be discharged with a nicotine patch.   4.  Hypertension.  Continued on his outpatient medications and needs close follow-up.   DISCHARGE MEDICATIONS:   1.  Atorvastatin 20 mg at bedtime. 2.  Hydrochlorothiazide /lisinopril 25/20 daily.  3.  Ventolin 2 puffs 4 times a day p.r.n.  4.  Nicotine patch 21 mg per 24 hours.   DISCHARGE DIET: Regular diet, low sodium.   DISCHARGE ACTIVITY: As tolerated.   DISCHARGE FOLLOWUP:  The patient will follow up with Dr. Ma Hillock in 1- 2 weeks if he cannot obtain an oncology consultation in [place].   The patient is stable for discharge. Plan of care was discussed with the patient.   TIME SPENT: Approximately 40 minutes.    ____________________________ Donell Beers. Benjie Karvonen, MD spm:ts D: 11/07/2013 12:56:33 ET T: 11/07/2013 17:14:10 ET JOB#: [id number]  cc: Hanin Decook P. Benjie Karvonen, MD, <Dictator> Donell Beers Khamani Daniely MD ELECTRONICALLY SIGNED 11/07/2013 20:27

## 2014-08-07 NOTE — H&P (Signed)
PATIENT NAME:  Edward Day, Edward Day MR#:  [id number] DATE OF BIRTH:  Nov 23, 1985  DATE OF ADMISSION:  11/06/2013  PRIMARY CARE PROVIDER:     EMERGENCY DEPARTMENT REFERRING PHYSICIAN: Sheryl L. Benjaman Lobe, MD.  CHIEF COMPLAINT: Chest pain, chest pressure.   HISTORY OF PRESENT ILLNESS: The patient is a 29 year old white male with history of nicotine addiction, who smokes 2 packs per day for the past 10-15 years, presents with having complaint of having shortness of breath, ongoing for the past 5-6 days, just feeling generalized weakness. He also has chronic cough and wheezing that he reports is actually a little better over the past few days. He states that he starting today, he started having chest tightness on the left side of his chest. Also had some uncomfortable feeling in his shoulder and his jaw. He went to the urgent care. They apparently checked at EKG and felt that he was having an MI, so they sent him to the ED.  The patient, in the ER, had an EKG, which did not show any ST-T wave elevations. His troponin is less than 0.02, but his hemoglobin is noted to be 20.3, and a hematocrit of 61.3. The patient, otherwise, denies any fevers, chills, has some baseline  shortness of breath with exertion. Denies any lower extremity swelling, has had a sleep study, which he reports incomplete.   PAST MEDICAL HISTORY: Significant history for migraines, history of hypertension, history of low testosterone, hyperlipidemia. The patient has been treated for acute bronchitis in the past and is on chronic inhalers, but no diagnosis of COPD.   PAST SURGICAL HISTORY: History right hand surgery, appendectomy, left elbow surgery, left shoulder surgery.   ALLERGIES:  Bananas.   CURRENT MEDICATIONS AT HOME: He is on Ventolin 2 puffs 4 times a day as needed, testosterone 200 mg/mL IM solutions every once a week, simvastatin 20 daily, hydrochlorothiazide/lisinopril 25/20 daily.   SOCIAL HISTORY: He currently works in the  prison.  He smokes 2 packs per day for 15 years.  Drinks socially. No drug use.   FAMILY HISTORY: Grandmother had problems with her heart; he is not sure what age. Mother with high blood pressure. Father with diabetes.   REVIEW OF SYSTEMS:  CONSTITUTIONAL: Denies any fevers, but complains sweating, complains of fatigue, weakness. No weight loss. No weight gain.  EYES: No blurred or double vision. No pain. No redness. No inflammation. No glaucoma. No cataracts.  ENT: No tinnitus. No ear pain. No hearing loss. No seasonal or year-round allergies. No epistaxis. No difficulty swallowing.  RESPIRATORY: Complains of chronic cough, wheezing, dyspnea. No painful respiration.  CARDIOVASCULAR: Complains of chest pressure. No orthopnea. No edema. No arrhythmia. Complains of dyspnea on exertion. No syncope.  GASTROINTESTINAL: No nausea, vomiting.  Did have some nausea earlier today, but no vomiting. Denies any abdominal pain, hematemesis, melena, or ulcer.  GENITOURINARY: Denies any dysuria, hematuria, renal colic or frequency.  ENDOCRINE: Denies any polyuria, nocturia or thyroid problems.  HEMATOLOGIC AND LYMPHATIC: No history of anemia, easy bruisability or bleeding.  MUSCULOSKELETAL: Denies any pain in the neck, back or shoulder.  NEUROLOGIC: No numbness, weakness, CVA, TIA or seizures. Has a migraine.  PSYCHIATRIC: Denies any anxiety, insomnia or ADD.   PHYSICAL EXAMINATION: VITAL SIGNS: Temperature 98.4, pulse 115 on presentation, respirations 24, blood pressure 151/71, O2 of 94%.  GENERAL: The patient is a well developed, thin male, obese in no acute distress.  HEENT: Head atraumatic, normocephalic. Pupils equally round, reactive to light and accommodation. There are  no conjunctivae, pallor. No scleral icterus. Nasal exam shows no drainage or ulceration.  Oropharynx is clear without any exudate.  NECK: Supple without any JVD.  CARDIOVASCULAR: Regular rate and rhythm. No murmurs, rubs, clicks, or  gallops.  LUNGS: Clear to auscultation bilaterally without any rales, rhonchi, wheezing.  ABDOMEN: Soft, nontender, nondistended. Positive bowel sounds x4.  EXTREMITIES: No clubbing, cyanosis, or edema.  SKIN: No rash.  LYMPHATICS: No lymph nodes palpable.  VASCULAR: Good DP, PT pulses.  PSYCHIATRIC: Not anxious or depressed.  NEUROLOGIC: Awake, alert, oriented x3.  VASCULAR: Good DP, PT pulses.  EKG shows normal sinus rhythm with no ST-T wave changes.   LABORATORY DATA:  His glucose 116, BUN 8, creatinine 1.14, sodium 137, potassium 3.3, chloride 101, CO2 is 28, calcium 8.9. LFTs: Total protein 8.3, albumin 3.6, bilirubin total 0.3, alkaline phosphatase 83, AST 42. CPK 302, CK-MB 1.5. Troponin less than 0.02. WBC 13.4, hemoglobin 20.3, platelet count 294,000.   RADIOLOGY DATA:  Shows no acute abnormality noted.  ASSESSMENT AND PLAN: The patient is a 29 year old white male with history of significant smoking, presents with shortness of breath, chest pain, hemoglobin significantly elevated.  1.  Chest pain, likely secondary to polycythemia due to his smoking. At this time, the patient is already started on heparin, which will keep. Will get serial cardiac enzymes, cardiology evaluation. Will do serial cardiac enzymes. I will schedule him for a Lexi-MIBI in the morning, unless his troponin has elevated.  If it is, then we will cancel the MIBI.  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 I will check an ABG.  Likely, due to underlying lung disease and smoking. Will ask him to see the patient.  2.  Hyperlipidemia: Will check a fasting lipid panel.  Continue simvastatin.  3.  Likely chronic obstructive pulmonary disease.  I am going to continue the Combivent inhaler.  I will add Advair and Spiriva to his current regimen.  4.  Nicotine addiction. Smoking cessation given 4 minutes spent. Nicotine patch to be started. I strongly recommended the patient to stop smoking.   TIME SPENT: 50  minutes.   ____________________________ Lafonda Mosses Posey Pronto, MD shp:ds D: 11/06/2013 17:20:35 ET T: 11/06/2013 18:27:20 ET JOB#: [id number]  cc: Chandlar Guice H. Posey Pronto, MD, <Dictator> Alric Seton MD ELECTRONICALLY SIGNED 11/14/2013 11:06

## 2014-08-07 NOTE — Consult Note (Signed)
Came to see patient but he has already been discharged and has left.  Electronic Signatures: Jonn Shingles (MD)  (Signed on 25-Jul-15 13:42)  Authored  Last Updated: 25-Jul-15 13:42 by Jonn Shingles (MD)

## 2015-01-24 ENCOUNTER — Other Ambulatory Visit: Payer: Self-pay

## 2015-01-24 ENCOUNTER — Ambulatory Visit: Payer: Self-pay | Admitting: Physician Assistant

## 2015-01-24 ENCOUNTER — Encounter: Payer: Self-pay | Admitting: Physician Assistant

## 2015-01-24 VITALS — BP 140/98

## 2015-01-24 DIAGNOSIS — M542 Cervicalgia: Secondary | ICD-10-CM

## 2015-01-24 DIAGNOSIS — D582 Other hemoglobinopathies: Secondary | ICD-10-CM

## 2015-01-24 MED ORDER — PREDNISONE 10 MG (21) PO TBPK
10.0000 mg | ORAL_TABLET | Freq: Every day | ORAL | Status: DC
Start: 2015-01-24 — End: 2015-04-25

## 2015-01-24 NOTE — Progress Notes (Signed)
S: c/o neck pain, no known injury, no radiation to arms, no numbness or tingling, pain is in back of neck in center and pulls on muscles of upper back, has seen chiropractor without relief  O: bp a little elevated but better than previous readings, lungs c t a, cv rrr, cspine tender in center along c6-c7, full rom of neck , grips = b/l, n/v intact  A:  Neck pain, ? Bulging disc  P: sterapred ds 10mg  6 d dose pack

## 2015-01-25 ENCOUNTER — Telehealth: Payer: Self-pay | Admitting: Physician Assistant

## 2015-01-25 LAB — CMP12+LP+TP+TSH+6AC+CBC/D/PLT
ALBUMIN: 4.2 g/dL (ref 3.5–5.5)
ALK PHOS: 70 IU/L (ref 39–117)
ALT: 31 IU/L (ref 0–44)
AST: 23 IU/L (ref 0–40)
Albumin/Globulin Ratio: 1.6 (ref 1.1–2.5)
BUN/Creatinine Ratio: 14 (ref 8–19)
BUN: 11 mg/dL (ref 6–20)
Basophils Absolute: 0 10*3/uL (ref 0.0–0.2)
Basos: 0 %
Bilirubin Total: <0.2 mg/dL (ref 0.0–1.2)
CALCIUM: 9.6 mg/dL (ref 8.7–10.2)
CHOLESTEROL TOTAL: 219 mg/dL — AB (ref 100–199)
Chloride: 102 mmol/L (ref 97–108)
Chol/HDL Ratio: 5.3 ratio units — ABNORMAL HIGH (ref 0.0–5.0)
Creatinine, Ser: 0.77 mg/dL (ref 0.76–1.27)
EOS (ABSOLUTE): 0.1 10*3/uL (ref 0.0–0.4)
ESTIMATED CHD RISK: 1.1 times avg. — AB (ref 0.0–1.0)
Eos: 1 %
FREE THYROXINE INDEX: 2.2 (ref 1.2–4.9)
GFR calc Af Amer: 141 mL/min/{1.73_m2} (ref >59)
GFR calc non Af Amer: 122 mL/min/{1.73_m2} (ref >59)
GGT: 46 IU/L (ref 0–65)
GLOBULIN, TOTAL: 2.7 g/dL (ref 1.5–4.5)
Glucose: 83 mg/dL (ref 65–99)
HDL: 41 mg/dL (ref >39)
Hematocrit: 50.7 % (ref 37.5–51.0)
Hemoglobin: 16.7 g/dL (ref 12.6–17.7)
IMMATURE GRANS (ABS): 0 10*3/uL (ref 0.0–0.1)
IMMATURE GRANULOCYTES: 0 %
IRON: 58 ug/dL (ref 38–169)
LDH: 148 IU/L (ref 121–224)
LDL Calculated: 132 mg/dL — ABNORMAL HIGH (ref 0–99)
LYMPHS ABS: 2.1 10*3/uL (ref 0.7–3.1)
LYMPHS: 26 %
MCH: 29.8 pg (ref 26.6–33.0)
MCHC: 32.9 g/dL (ref 31.5–35.7)
MCV: 91 fL (ref 79–97)
MONOS ABS: 0.7 10*3/uL (ref 0.1–0.9)
Monocytes: 9 %
NEUTROS PCT: 64 %
Neutrophils Absolute: 5.3 10*3/uL (ref 1.4–7.0)
POTASSIUM: 4.5 mmol/L (ref 3.5–5.2)
Phosphorus: 2.8 mg/dL (ref 2.5–4.5)
Platelets: 222 10*3/uL (ref 150–379)
RBC: 5.6 x10E6/uL (ref 4.14–5.80)
RDW: 15 % (ref 12.3–15.4)
SODIUM: 142 mmol/L (ref 134–144)
T3 UPTAKE RATIO: 28 % (ref 24–39)
T4 TOTAL: 7.9 ug/dL (ref 4.5–12.0)
TOTAL PROTEIN: 6.9 g/dL (ref 6.0–8.5)
TRIGLYCERIDES: 231 mg/dL — AB (ref 0–149)
TSH: 1.3 u[IU]/mL (ref 0.450–4.500)
Uric Acid: 7.1 mg/dL (ref 3.7–8.6)
VLDL Cholesterol Cal: 46 mg/dL — ABNORMAL HIGH (ref 5–40)
WBC: 8.2 10*3/uL (ref 3.4–10.8)

## 2015-01-25 NOTE — Telephone Encounter (Signed)
Pt will return for fasting lipid panel.  We will add testosterone level.  Will diet and exercise for cholesterol.  Aware of hematocrit level and will have blood drawn off.

## 2015-01-26 LAB — SPECIMEN STATUS REPORT

## 2015-01-26 LAB — TESTOSTERONE: TESTOSTERONE: 361 ng/dL (ref 348–1197)

## 2015-01-26 LAB — HGB A1C W/O EAG: Hgb A1c MFr Bld: 5.6 % (ref 4.8–5.6)

## 2015-01-31 NOTE — Progress Notes (Signed)
Spoke with patient about his lab results and he expressed understanding.  Pt expressed that [facility] will [facility] Hematocrit level.

## 2015-02-25 ENCOUNTER — Ambulatory Visit: Payer: Self-pay | Admitting: Internal Medicine

## 2015-03-16 ENCOUNTER — Other Ambulatory Visit: Payer: Self-pay | Admitting: Physician Assistant

## 2015-03-18 ENCOUNTER — Other Ambulatory Visit: Payer: Self-pay | Admitting: Emergency Medicine

## 2015-03-18 ENCOUNTER — Other Ambulatory Visit: Payer: Self-pay | Admitting: Physician Assistant

## 2015-03-18 MED ORDER — TRAZODONE HCL 150 MG PO TABS: 150.0000 mg | ORAL_TABLET | Freq: Every evening | ORAL | Status: DC | PRN

## 2015-03-22 ENCOUNTER — Ambulatory Visit: Payer: Self-pay | Admitting: Physician Assistant

## 2015-03-22 VITALS — BP 130/100 | HR 87 | Temp 98.2°F

## 2015-03-22 DIAGNOSIS — G4700 Insomnia, unspecified: Secondary | ICD-10-CM

## 2015-03-22 MED ORDER — TRAZODONE HCL 150 MG PO TABS: 150.0000 mg | ORAL_TABLET | Freq: Every evening | ORAL | Status: DC | PRN

## 2015-03-22 NOTE — Addendum Note (Signed)
Addended by: Rudene Anda T on: 03/22/2015 03:07 PM   Modules accepted: Orders

## 2015-03-22 NOTE — Progress Notes (Signed)
S: here for recheck of hct level, feels like his blood is "thick" ; not fasting, had high cholesterol last blood draw, bp is always elevated with blood thickening, also [facility]'t [facility]  O: vitals w slightly elevated bp at 130/100; lungs c t a,c v rrr  A: insomnia, hemochromatosis    P: labs today for cbc, will return in 1 month for fasting labs to recheck cholesterol, resent rx for trazadone, spoke with pharmacist and he didn't receive first transmission on 12/2, did receive todays

## 2015-03-23 LAB — HEMATOCRIT: Hematocrit: 50.2 % (ref 37.5–51.0)

## 2015-03-23 NOTE — Progress Notes (Signed)
Left a message on patient's voicemail.

## 2015-04-22 ENCOUNTER — Other Ambulatory Visit: Payer: Self-pay | Admitting: Emergency Medicine

## 2015-04-22 MED ORDER — CLOMIPHENE CITRATE 50 MG PO TABS: 50.0000 mg | ORAL_TABLET | ORAL | Status: DC

## 2015-04-22 NOTE — Telephone Encounter (Signed)
Med refill approved 

## 2015-04-22 NOTE — Telephone Encounter (Signed)
Patient called and requested a refill for his Clomid.

## 2015-04-25 ENCOUNTER — Ambulatory Visit: Payer: Self-pay | Admitting: Physician Assistant

## 2015-04-25 ENCOUNTER — Encounter: Payer: Self-pay | Admitting: Physician Assistant

## 2015-04-25 VITALS — BP 160/90 | HR 94 | Temp 97.5°F

## 2015-04-25 DIAGNOSIS — J069 Acute upper respiratory infection, unspecified: Secondary | ICD-10-CM

## 2015-04-25 MED ORDER — METHYLPREDNISOLONE 4 MG PO TBPK: ORAL_TABLET | ORAL | Status: DC

## 2015-04-25 MED ORDER — HYDROCOD POLST-CPM POLST ER 10-8 MG/5ML PO SUER: 5.0000 mL | Freq: Two times a day (BID) | ORAL | Status: DC | PRN

## 2015-04-25 MED ORDER — AMOXICILLIN-POT CLAVULANATE 875-125 MG PO TABS: 1.0000 | ORAL_TABLET | Freq: Two times a day (BID) | ORAL | Status: DC

## 2015-04-25 NOTE — Progress Notes (Signed)
S: C/o runny nose and congestion for 3 days, cough with some wheezing, no fever, chills, cp/sob, v/d; mucus was green this am but clear throughout the day, cough is sporadic, hx of pneumonia  O: PE: vitals wnl, nad, perrl eomi, normocephalic, tms dull, nasal mucosa red and swollen, throat injected, neck supple no lymph, lungs c t a, cv rrr, neuro intact, cough is dry  A:  Acute uri   P: augmentin, medrol , tussionex, drink fluids, continue regular meds , use otc meds of choice, return if not improving in 5 days, return earlier if worsening

## 2015-06-06 ENCOUNTER — Other Ambulatory Visit: Payer: Self-pay | Admitting: Physician Assistant

## 2015-06-06 ENCOUNTER — Ambulatory Visit: Payer: Self-pay | Admitting: Physician Assistant

## 2015-06-06 ENCOUNTER — Encounter: Payer: Self-pay | Admitting: Physician Assistant

## 2015-06-06 VITALS — BP 160/90 | HR 94 | Temp 97.7°F

## 2015-06-06 DIAGNOSIS — J069 Acute upper respiratory infection, unspecified: Secondary | ICD-10-CM

## 2015-06-06 MED ORDER — HYDROCOD POLST-CPM POLST ER 10-8 MG/5ML PO SUER: 5.0000 mL | Freq: Two times a day (BID) | ORAL | Status: DC | PRN

## 2015-06-06 NOTE — Telephone Encounter (Signed)
Pt recently seen in clinic, med refill approved

## 2015-06-06 NOTE — Progress Notes (Signed)
S: C/o runny nose and congestion for 7 days, cough and chest tighness, no fever, chills, cp/sob, v/d; mucus was green this am but clear throughout the day, cough is sporadic, started taking levaquin last week, is worried his hct is elevated  Using otc meds: robitussin  O: PE: perrl eomi, normocephalic, tms dull, nasal mucosa red and swollen, throat injected, neck supple no lymph, lungs c t a, cv rrr, neuro intact  A:  Acute  uri   P: finish levaquin, tussionex 176ml if needed for cough; drink fluids, continue regular meds , use otc meds of choice, return if not improving in 5 days, return earlier if worsening , cbc drawn today

## 2015-06-07 LAB — CBC WITH DIFFERENTIAL/PLATELET
BASOS ABS: 0 10*3/uL (ref 0.0–0.2)
Basos: 0 %
EOS (ABSOLUTE): 0.1 10*3/uL (ref 0.0–0.4)
Eos: 1 %
Hematocrit: 46.2 % (ref 37.5–51.0)
Hemoglobin: 15.9 g/dL (ref 12.6–17.7)
Immature Grans (Abs): 0 10*3/uL (ref 0.0–0.1)
Immature Granulocytes: 0 %
LYMPHS ABS: 3.2 10*3/uL — AB (ref 0.7–3.1)
Lymphs: 37 %
MCH: 31.2 pg (ref 26.6–33.0)
MCHC: 34.4 g/dL (ref 31.5–35.7)
MCV: 91 fL (ref 79–97)
MONOS ABS: 0.4 10*3/uL (ref 0.1–0.9)
Monocytes: 5 %
NEUTROS ABS: 4.9 10*3/uL (ref 1.4–7.0)
Neutrophils: 57 %
Platelets: 218 10*3/uL (ref 150–379)
RBC: 5.1 x10E6/uL (ref 4.14–5.80)
RDW: 14.1 % (ref 12.3–15.4)
WBC: 8.7 10*3/uL (ref 3.4–10.8)

## 2015-06-07 LAB — SPECIMEN STATUS REPORT

## 2015-06-08 NOTE — Progress Notes (Signed)
Left message on patient's voicemail.

## 2015-07-01 ENCOUNTER — Other Ambulatory Visit: Payer: Self-pay | Admitting: Emergency Medicine

## 2015-07-01 MED ORDER — CLOMIPHENE CITRATE 50 MG PO TABS: 50.0000 mg | ORAL_TABLET | ORAL | Status: DC

## 2015-07-01 NOTE — Telephone Encounter (Signed)
Patient called and requested a refill for his Clomid to be sent in to [facility] on [address].

## 2015-07-01 NOTE — Telephone Encounter (Signed)
Med refill approved 

## 2015-07-05 ENCOUNTER — Encounter: Payer: Self-pay | Admitting: Physician Assistant

## 2015-07-05 ENCOUNTER — Ambulatory Visit: Payer: Self-pay | Admitting: Physician Assistant

## 2015-07-05 VITALS — BP 155/90 | HR 99 | Temp 98.5°F

## 2015-07-05 DIAGNOSIS — R1031 Right lower quadrant pain: Secondary | ICD-10-CM

## 2015-07-05 NOTE — Progress Notes (Signed)
S: c/o rlq pain, can stick his fingers through muscle and into abdomen, states is still able to have BM but its not comfortable, keeps having a burning sensation in the area followed by a lot of pain, denies fever/chills/v/d; does not have appendix; also states the right testicle is elevated above the left  O: vitals wnl, nad, abd soft tender in rlq, able to push through muscle wall with 2 fingers, no bowel entrapment felt, n/v intact  A: rlq pain, hernia  P: refer to surgery, pt states he doesn't want to do CT first due to cost, hoping the surgeon will be able to fix it without doing CT

## 2015-07-06 ENCOUNTER — Ambulatory Visit (INDEPENDENT_AMBULATORY_CARE_PROVIDER_SITE_OTHER): Payer: Managed Care, Other (non HMO) | Admitting: General Surgery

## 2015-07-06 ENCOUNTER — Encounter: Payer: Self-pay | Admitting: General Surgery

## 2015-07-06 VITALS — BP 178/98 | HR 96 | Resp 16 | Ht 73.0 in | Wt 307.0 lb

## 2015-07-06 DIAGNOSIS — R1031 Right lower quadrant pain: Secondary | ICD-10-CM
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
## 2015-07-06 NOTE — Patient Instructions (Addendum)
Start anti-inflammatory medication-aleve 2 tablets daily and cold compresses at the end of the day. Call if symptoms don't resolve or worsen.  Patient has been scheduled for a CT abdomen/pelvis without contrast (no IV and no oral contrast) at [place] for 07-13-15 at 9 am (arrive 8:45 am). Prep: no solids 4 hours prior. Patient verbalizes understanding.

## 2015-07-13 ENCOUNTER — Telehealth: Payer: Self-pay | Admitting: *Deleted

## 2015-07-13 ENCOUNTER — Ambulatory Visit
Admission: RE | Admit: 2015-07-13 | Discharge: 2015-07-13 | Disposition: A | Discharge status: Home / Self Care | Payer: Managed Care, Other (non HMO) | Source: Ambulatory Visit | Attending: General Surgery | Admitting: General Surgery

## 2015-07-13 DIAGNOSIS — R1031 Right lower quadrant pain: Secondary | ICD-10-CM

## 2015-07-13 NOTE — Telephone Encounter (Signed)
Please have pt come back to see me i 1-2 weeks for reexam  and discussion

## 2015-07-13 NOTE — Telephone Encounter (Signed)
Patient has been scheduled for an appointment on 07-25-15 at 4 pm.

## 2015-07-13 NOTE — Telephone Encounter (Signed)
Patient went to get his CT done today and it was going to cost him to much, so he is not going to be able to afford getting that done. Is there anything else he can do?

## 2015-07-25 ENCOUNTER — Ambulatory Visit: Payer: Managed Care, Other (non HMO) | Admitting: General Surgery

## 2015-09-16 ENCOUNTER — Ambulatory Visit: Payer: Self-pay | Admitting: Physician Assistant

## 2015-09-16 ENCOUNTER — Encounter: Payer: Self-pay | Admitting: Physician Assistant

## 2015-09-16 VITALS — BP 140/100 | HR 84 | Temp 98.3°F

## 2015-09-16 DIAGNOSIS — F4321 Adjustment disorder with depressed mood: Secondary | ICD-10-CM

## 2015-09-16 NOTE — Progress Notes (Signed)
S: c/o anxiety and grief, can't sleep, thoughts are racing, feels like its his fault, his wife miscarried this week at [redacted] weeks gestation, took an old xanax last night which helped a little, denies si/hi,   O: vitals with elevated bp, mood and affect depressed, neuro intact  A: anxiety/grief reaction  P: xanax 0.5 mg #20 nr, call if worsening

## 2015-09-26 ENCOUNTER — Ambulatory Visit: Payer: Self-pay | Admitting: Physician Assistant

## 2015-09-26 ENCOUNTER — Encounter: Payer: Self-pay | Admitting: Physician Assistant

## 2015-09-26 VITALS — BP 122/80 | HR 79 | Temp 98.5°F

## 2015-09-26 DIAGNOSIS — R21 Rash and other nonspecific skin eruption: Secondary | ICD-10-CM

## 2015-09-26 DIAGNOSIS — Z299 Encounter for prophylactic measures, unspecified: Secondary | ICD-10-CM

## 2015-09-26 MED ORDER — DEXAMETHASONE SODIUM PHOSPHATE 10 MG/ML IJ SOLN
10.0000 mg | Freq: Once | INTRAMUSCULAR | Status: AC
  Administered 2015-09-26: 10 mg via INTRAMUSCULAR

## 2015-09-26 NOTE — Addendum Note (Signed)
Addended by: Rudene Anda T on: 09/26/2015 02:37 PM   Modules accepted: Orders

## 2015-09-26 NOTE — Progress Notes (Signed)
S: c/o rash on face, area in beard and on arms, not itchy just bothersome, but area around face/nose is itching, does a lot of work outside, no known exposure to poison ivy/oak  O: vitals wnl, nad, skin with small flat bumps under beard, some pustules on arms around follicles, no redness or drainage, n/v intact  A: heat rash, contact dermatitis  P: decadron 10mg  im

## 2015-09-27 LAB — CMP12+LP+TP+TSH+6AC+PSA+CBC…
ALBUMIN: 4.3 g/dL (ref 3.5–5.5)
ALT: 35 IU/L (ref 0–44)
AST: 25 IU/L (ref 0–40)
Albumin/Globulin Ratio: 1.5 (ref 1.2–2.2)
Alkaline Phosphatase: 65 IU/L (ref 39–117)
BASOS ABS: 0 10*3/uL (ref 0.0–0.2)
BASOS: 0 %
BUN / CREAT RATIO: 11 (ref 9–20)
BUN: 10 mg/dL (ref 6–20)
Bilirubin Total: 0.3 mg/dL (ref 0.0–1.2)
CALCIUM: 9.4 mg/dL (ref 8.7–10.2)
CHLORIDE: 104 mmol/L (ref 96–106)
CREATININE: 0.92 mg/dL (ref 0.76–1.27)
Chol/HDL Ratio: 4.9 ratio units (ref 0.0–5.0)
Cholesterol, Total: 216 mg/dL — ABNORMAL HIGH (ref 100–199)
EOS (ABSOLUTE): 0.2 10*3/uL (ref 0.0–0.4)
EOS: 2 %
ESTIMATED CHD RISK: 1 times avg. (ref 0.0–1.0)
FREE THYROXINE INDEX: 1.8 (ref 1.2–4.9)
GFR, EST AFRICAN AMERICAN: 128 mL/min/{1.73_m2} (ref >59)
GFR, EST NON AFRICAN AMERICAN: 110 mL/min/{1.73_m2} (ref >59)
GGT: 37 IU/L (ref 0–65)
GLUCOSE: 86 mg/dL (ref 65–99)
Globulin, Total: 2.8 g/dL (ref 1.5–4.5)
HDL: 44 mg/dL (ref >39)
HEMOGLOBIN: 17 g/dL (ref 12.6–17.7)
Hematocrit: 49.9 % (ref 37.5–51.0)
IRON: 91 ug/dL (ref 38–169)
Immature Grans (Abs): 0 10*3/uL (ref 0.0–0.1)
Immature Granulocytes: 0 %
LDH: 152 IU/L (ref 121–224)
LDL CALC: 140 mg/dL — AB (ref 0–99)
LYMPHS ABS: 3.9 10*3/uL — AB (ref 0.7–3.1)
Lymphs: 40 %
MCH: 31.1 pg (ref 26.6–33.0)
MCHC: 34.1 g/dL (ref 31.5–35.7)
MCV: 91 fL (ref 79–97)
MONOCYTES: 6 %
Monocytes Absolute: 0.6 10*3/uL (ref 0.1–0.9)
NEUTROS ABS: 5.1 10*3/uL (ref 1.4–7.0)
Neutrophils: 52 %
PLATELETS: 213 10*3/uL (ref 150–379)
POTASSIUM: 4.2 mmol/L (ref 3.5–5.2)
Phosphorus: 3.4 mg/dL (ref 2.5–4.5)
Prostate Specific Ag, Serum: 0.6 ng/mL (ref 0.0–4.0)
RBC: 5.46 x10E6/uL (ref 4.14–5.80)
RDW: 14.2 % (ref 12.3–15.4)
SODIUM: 144 mmol/L (ref 134–144)
T3 UPTAKE RATIO: 27 % (ref 24–39)
T4, Total: 6.5 ug/dL (ref 4.5–12.0)
TOTAL PROTEIN: 7.1 g/dL (ref 6.0–8.5)
TSH: 2.5 u[IU]/mL (ref 0.450–4.500)
Triglycerides: 162 mg/dL — ABNORMAL HIGH (ref 0–149)
URIC ACID: 6.5 mg/dL (ref 3.7–8.6)
VLDL Cholesterol Cal: 32 mg/dL (ref 5–40)
WBC: 9.7 10*3/uL (ref 3.4–10.8)

## 2015-09-27 LAB — TESTOSTERONE: Testosterone: 370 ng/dL (ref 348–1197)

## 2015-09-29 LAB — SPECIMEN STATUS REPORT

## 2015-09-29 LAB — HGB A1C W/O EAG: Hgb A1c MFr Bld: 5.4 % (ref 4.8–5.6)

## 2015-10-10 ENCOUNTER — Encounter: Payer: Self-pay | Admitting: Physician Assistant

## 2015-10-10 ENCOUNTER — Ambulatory Visit: Payer: Self-pay | Admitting: Physician Assistant

## 2015-10-10 VITALS — BP 140/80 | HR 88 | Temp 98.4°F

## 2015-10-10 DIAGNOSIS — M7711 Lateral epicondylitis, right elbow: Secondary | ICD-10-CM

## 2015-10-10 NOTE — Progress Notes (Signed)
S: c/o r elbow pain, sx for 3 months, increased pain when reaches to pick up cup, with grip and supination  O: vitals wnl, nad, skin intact, r elbow without bony tenderness, full rom, pain reproduced with supination and with gripped reach, n/v intact  A: tennis elbow  P: otc turmeric, voltaren gel, tennis elbow splint applied

## 2015-11-29 ENCOUNTER — Encounter: Payer: Self-pay | Admitting: Physician Assistant

## 2015-11-29 ENCOUNTER — Ambulatory Visit: Payer: Self-pay | Admitting: Physician Assistant

## 2015-11-29 VITALS — BP 150/70 | HR 92 | Temp 98.3°F

## 2015-11-29 DIAGNOSIS — R509 Fever, unspecified: Secondary | ICD-10-CM

## 2015-11-29 DIAGNOSIS — W57XXXA Bitten or stung by nonvenomous insect and other nonvenomous arthropods, initial encounter: Secondary | ICD-10-CM

## 2015-11-29 MED ORDER — LEVOFLOXACIN 500 MG PO TABS: 500.0000 mg | ORAL_TABLET | Freq: Every day | ORAL | 0 refills | Status: DC

## 2015-11-29 MED ORDER — HYDROCOD POLST-CPM POLST ER 10-8 MG/5ML PO SUER: 5.0000 mL | Freq: Two times a day (BID) | ORAL | 0 refills | Status: DC | PRN

## 2015-11-29 MED ORDER — ALBUTEROL SULFATE HFA 108 (90 BASE) MCG/ACT IN AERS: 2.0000 | INHALATION_SPRAY | Freq: Four times a day (QID) | RESPIRATORY_TRACT | 0 refills | Status: DC | PRN

## 2015-11-29 MED ORDER — ALBUTEROL SULFATE (2.5 MG/3ML) 0.083% IN NEBU: 2.5000 mg | INHALATION_SOLUTION | Freq: Four times a day (QID) | RESPIRATORY_TRACT | 1 refills | Status: DC | PRN

## 2015-11-29 NOTE — Addendum Note (Signed)
Addended by: Versie Starks on: 11/29/2015 03:12 PM   Modules accepted: Orders

## 2015-11-29 NOTE — Progress Notes (Signed)
S: C/o cough and congestion with wheezing and chest pain, chest is sore from coughing, + fever, chills, body aches;  cough is dry and hacking; keeping pt awake at night;  denies cardiac type chest pain or sob, v/d, abd pain, did pull a tick off this weekend Remainder ros neg  O: vitals wnl, nad, tms clear, throat injected, neck supple no lymph, lungs with wheezing in upper fields, cv rrr, neuro intact  A:  Acute bronchitis   P:  rx medication:  levaquin 500mg  qd, albuterol inhaler, tussionex; ; smoking cessation, use otc meds, tylenol or motrin as needed for fever/chills, return if not better in 3 -5 days, return earlier if worsening, labs for cbc, lymes, rmsf

## 2015-11-30 LAB — CBC WITH DIFFERENTIAL/PLATELET
BASOS: 0 %
Basophils Absolute: 0 10*3/uL (ref 0.0–0.2)
EOS (ABSOLUTE): 0.1 10*3/uL (ref 0.0–0.4)
EOS: 1 %
HEMATOCRIT: 50.2 % (ref 37.5–51.0)
HEMOGLOBIN: 16.9 g/dL (ref 12.6–17.7)
IMMATURE GRANS (ABS): 0 10*3/uL (ref 0.0–0.1)
IMMATURE GRANULOCYTES: 0 %
LYMPHS: 20 %
Lymphocytes Absolute: 1.6 10*3/uL (ref 0.7–3.1)
MCH: 31.6 pg (ref 26.6–33.0)
MCHC: 33.7 g/dL (ref 31.5–35.7)
MCV: 94 fL (ref 79–97)
MONOCYTES: 8 %
Monocytes Absolute: 0.6 10*3/uL (ref 0.1–0.9)
NEUTROS ABS: 5.7 10*3/uL (ref 1.4–7.0)
Neutrophils: 71 %
Platelets: 188 10*3/uL (ref 150–379)
RBC: 5.34 x10E6/uL (ref 4.14–5.80)
RDW: 14.1 % (ref 12.3–15.4)
WBC: 8.1 10*3/uL (ref 3.4–10.8)

## 2015-11-30 LAB — B. BURGDORFI ANTIBODIES

## 2015-11-30 LAB — ROCKY MTN SPOTTED FVR AB, IGM-BLOOD

## 2015-11-30 LAB — ROCKY MTN SPOTTED FVR AB, IGG-BLOOD

## 2015-11-30 NOTE — Addendum Note (Signed)
Addended by: Rudene Anda T on: 11/30/2015 08:58 AM   Modules accepted: Orders

## 2015-12-02 LAB — ROCKY MTN SPOTTED FVR AB, IGG-BLOOD: RMSF IGG: NEGATIVE

## 2015-12-02 LAB — ROCKY MTN SPOTTED FVR AB, IGM-BLOOD: RMSF IGM: 0.44 {index} (ref 0.00–0.89)

## 2015-12-02 LAB — B. BURGDORFI ANTIBODIES: Lyme IgG/IgM Ab: <0.91 {ISR} (ref 0.00–0.90)

## 2015-12-16 ENCOUNTER — Ambulatory Visit: Payer: Self-pay | Admitting: Physician Assistant

## 2015-12-16 ENCOUNTER — Encounter: Payer: Self-pay | Admitting: Physician Assistant

## 2015-12-16 VITALS — BP 159/100 | HR 86 | Temp 98.8°F

## 2015-12-16 DIAGNOSIS — J9801 Acute bronchospasm: Secondary | ICD-10-CM

## 2015-12-16 DIAGNOSIS — W57XXXA Bitten or stung by nonvenomous insect and other nonvenomous arthropods, initial encounter: Secondary | ICD-10-CM

## 2015-12-16 MED ORDER — IPRATROPIUM-ALBUTEROL 0.5-2.5 (3) MG/3ML IN SOLN: 3.0000 mL | Freq: Four times a day (QID) | RESPIRATORY_TRACT | Status: DC

## 2015-12-16 MED ORDER — PSEUDOEPH-BROMPHEN-DM 30-2-10 MG/5ML PO SYRP: 5.0000 mL | ORAL_SOLUTION | Freq: Four times a day (QID) | ORAL | 0 refills | Status: DC | PRN

## 2015-12-16 MED ORDER — SULFAMETHOXAZOLE-TRIMETHOPRIM 800-160 MG PO TABS: 1.0000 | ORAL_TABLET | Freq: Two times a day (BID) | ORAL | 0 refills | Status: DC

## 2015-12-16 MED ORDER — METHYLPREDNISOLONE 4 MG PO TBPK: ORAL_TABLET | ORAL | 0 refills | Status: DC

## 2015-12-16 MED ORDER — ALBUTEROL SULFATE (2.5 MG/3ML) 0.083% IN NEBU: 2.5000 mg | INHALATION_SOLUTION | Freq: Once | RESPIRATORY_TRACT | Status: DC

## 2015-12-16 MED ORDER — IPRATROPIUM-ALBUTEROL 0.5-2.5 (3) MG/3ML IN SOLN
3.0000 mL | Freq: Once | RESPIRATORY_TRACT | Status: AC
  Administered 2015-12-16: 3 mL via RESPIRATORY_TRACT

## 2015-12-16 MED ORDER — IPRATROPIUM BROMIDE 0.02 % IN SOLN: 0.5000 mg | Freq: Once | RESPIRATORY_TRACT | Status: DC

## 2015-12-16 NOTE — Addendum Note (Signed)
Addended by: Rudene Anda T on: 12/16/2015 12:27 PM   Modules accepted: Orders

## 2015-12-16 NOTE — Addendum Note (Signed)
Addended by: Sable Feil on: 12/16/2015 12:11 PM   Modules accepted: Orders

## 2015-12-16 NOTE — Addendum Note (Signed)
Addended by: Rudene Anda T on: 12/16/2015 12:30 PM   Modules accepted: Orders

## 2015-12-16 NOTE — Progress Notes (Signed)
   Subjective:    Patient ID: Edward Day, male    DOB: 1985/09/27, 30 y.o.   MRN: [id number]  HPI Patient c/o wheezing and cough for one week. Recently treated for bonchitis with Leavquin and Tussionex. S/P medication compliant returned. Denies fever/chill or N/V/D. No palliative measures for compliant.   Review of Systems    Negative except for compliant. Objective:   Physical Exam  HEENT unremarkable. Neck supple without adenopathy. Lungs with bilateral wheezing. Heat RRR. Erythematous papular lesion volar  aspect right hand.      Assessment & Plan:Bronchospasm and infected insect bite.  Bactrim DS, Bromfed DM, and Medrol dosepack.

## 2015-12-23 ENCOUNTER — Other Ambulatory Visit: Payer: Self-pay | Admitting: Physician Assistant

## 2015-12-23 NOTE — Telephone Encounter (Signed)
Med refill approved 

## 2016-02-18 ENCOUNTER — Other Ambulatory Visit: Payer: Self-pay | Admitting: Physician Assistant

## 2016-03-19 ENCOUNTER — Ambulatory Visit: Payer: Self-pay | Admitting: Physician Assistant

## 2016-03-19 ENCOUNTER — Encounter: Payer: Self-pay | Admitting: Physician Assistant

## 2016-03-19 VITALS — BP 140/90 | HR 89 | Temp 98.3°F

## 2016-03-19 DIAGNOSIS — M549 Dorsalgia, unspecified: Secondary | ICD-10-CM

## 2016-03-19 MED ORDER — METHYLPREDNISOLONE 4 MG PO TBPK: ORAL_TABLET | ORAL | 0 refills | Status: DC

## 2016-03-19 MED ORDER — BACLOFEN 10 MG PO TABS: 10.0000 mg | ORAL_TABLET | Freq: Three times a day (TID) | ORAL | 0 refills | Status: DC

## 2016-03-19 MED ORDER — KETOROLAC TROMETHAMINE 60 MG/2ML IM SOLN
60.0000 mg | Freq: Once | INTRAMUSCULAR | Status: AC
  Administered 2016-03-19: 60 mg via INTRAMUSCULAR

## 2016-03-19 NOTE — Progress Notes (Signed)
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

## 2016-05-31 ENCOUNTER — Ambulatory Visit: Payer: Self-pay | Admitting: Physician Assistant

## 2016-05-31 ENCOUNTER — Encounter: Payer: Self-pay | Admitting: Physician Assistant

## 2016-05-31 VITALS — BP 155/100 | HR 100 | Temp 97.8°F

## 2016-05-31 DIAGNOSIS — R1012 Left upper quadrant pain: Secondary | ICD-10-CM

## 2016-05-31 MED ORDER — PANTOPRAZOLE SODIUM 40 MG PO TBEC: 40.0000 mg | DELAYED_RELEASE_TABLET | Freq: Every day | ORAL | 3 refills | Status: DC

## 2016-05-31 MED ORDER — AMLODIPINE BESYLATE 5 MG PO TABS: 5.0000 mg | ORAL_TABLET | Freq: Every day | ORAL | 3 refills | Status: DC

## 2016-05-31 NOTE — Progress Notes (Addendum)
S: c/o left sided abdominal pain, diarrhea for 4 months, hasn't had a solid bm in 4 months, worse when he eats lettuce or roughage, this weekend had 2 episodes of vomiting, pain in upper left side is worse after eating, no blood in stool but has blood on the toilet paper, irritated from so much diarrhea, has a lot of burping and gas, has cut back on etoh, using vape instead of cigarettes, wife is 31 weeks preg  O: vitals with elevated bp 155/100, lungs c t a, cv rrr, abd soft tender along left side from umbilical area to llq, bs hyperactive in all 4 quads, n/v intact  A: abdominal pain, htn  P: protonix 40mg  qd, amlodipine 5mg  qd, refer to GI  H.pylori test is positive, [facility], left message for pt. Still needs to see GI

## 2016-06-01 LAB — SPECIMEN STATUS

## 2016-06-01 LAB — SPECIMEN STATUS REPORT

## 2016-06-02 LAB — COMPREHENSIVE METABOLIC PANEL WITH GFR
ALT: 74 IU/L — ABNORMAL HIGH (ref 0–44)
AST: 44 IU/L — ABNORMAL HIGH (ref 0–40)
Albumin/Globulin Ratio: 1.6 (ref 1.2–2.2)
Albumin: 4.4 g/dL (ref 3.5–5.5)
Alkaline Phosphatase: 64 IU/L (ref 39–117)
BUN/Creatinine Ratio: 16 (ref 9–20)
BUN: 16 mg/dL (ref 6–20)
Bilirubin Total: 0.2 mg/dL (ref 0.0–1.2)
CO2: 23 mmol/L (ref 18–29)
Calcium: 8.9 mg/dL (ref 8.7–10.2)
Chloride: 103 mmol/L (ref 96–106)
Creatinine, Ser: 1.02 mg/dL (ref 0.76–1.27)
GFR calc Af Amer: 113 mL/min/1.73
GFR calc non Af Amer: 97 mL/min/1.73
Globulin, Total: 2.8 g/dL (ref 1.5–4.5)
Glucose: 90 mg/dL (ref 65–99)
Potassium: 4.2 mmol/L (ref 3.5–5.2)
Sodium: 143 mmol/L (ref 134–144)
Total Protein: 7.2 g/dL (ref 6.0–8.5)

## 2016-06-02 LAB — H PYLORI, IGM, IGG, IGA AB: H. pylori, IgA Abs: 17.7 units — ABNORMAL HIGH (ref 0.0–8.9)

## 2016-06-02 LAB — LIPASE: Lipase: 36 U/L (ref 13–78)

## 2016-06-04 ENCOUNTER — Other Ambulatory Visit: Payer: Self-pay | Admitting: Physician Assistant

## 2016-06-04 DIAGNOSIS — Z299 Encounter for prophylactic measures, unspecified: Secondary | ICD-10-CM

## 2016-06-04 DIAGNOSIS — A048 Other specified bacterial intestinal infections: Secondary | ICD-10-CM

## 2016-06-04 MED ORDER — AMOXICILL-CLARITHRO-LANSOPRAZ PO MISC: ORAL | 0 refills | Status: DC

## 2016-06-04 NOTE — Patient Instructions (Addendum)
Helicobacter Pylori Infection Introduction Helicobacter pylori infection is an infection in the stomach that is caused by the Helicobacter pylori (H. pylori) bacteria. This type of bacteria often lives in the lining of the stomach. The infection can cause ulcers and irritation (gastritis) in some people. It is the most common cause of ulcers in the stomach (gastric ulcer) and in the upper part of the intestine (duodenal ulcer). Having this infection may also increase the risk of stomach cancer and a type of white blood cell cancer (lymphoma) that affects the stomach. What are the causes? H. pylori is a type of bacteria that is often found in the stomachs of healthy people. The bacteria may be passed from person to person through contact with stool or saliva. It is not known why some people develop ulcers, gastritis, or cancer from the infection. What increases the risk? This condition is more likely to develop in people who:  Have family members with the infection.  Live with many other people, such as in a dormitory.  Are of African, Hispanic, or Asian descent. What are the signs or symptoms? Most people with this infection do not have symptoms. If you do have symptoms, they may include:  Heartburn.  Stomach pain.  Nausea.  Vomiting.  Blood-tinged vomit.  Loss of appetite.  Bad breath. How is this diagnosed? This condition may be diagnosed based on your symptoms, a physical exam, and various tests. Tests may include:  Blood tests or stool tests to check for the proteins (antibodies) that your body may produce in response to the bacteria. These tests are the best way to confirm the diagnosis.  A breath test to check for the type of gas that the H. pylori bacteria release after breaking down a substance called urea. For the test, you are asked to drink urea. This test is often done after treatment in order to find out if the treatment worked.  A procedure in which a thin, flexible  tube with a tiny camera at the end is placed into your stomach and upper intestine (upper endoscopy). Your health care provider may also take tissue samples (biopsy) to test for H. pylori and cancer. How is this treated? Treatment for this condition usually involves taking a combination of medicines (triple therapy) for a couple of weeks. Triple therapy includes one medicine to reduce the acid in your stomach and two types of antibiotic medicines. Many drug combinations have been approved for treatment. Treatment usually kills the H. pylori and reduces your risk of cancer. You may need to be tested for H. pylori again after treatment. In some cases, the treatment may need to be repeated. Follow these instructions at home:  Take over-the-counter and prescription medicines only as told by your health care provider.  Take your antibiotics as told by your health care provider. Do not stop taking the antibiotics even if you start to feel better.  You can do all your usual activities and eat what you usually do.  Take steps to prevent future infections:  Wash your hands often.  Make sure the food you eat has been properly prepared.  Drink water only from clean sources.  Keep all follow-up visits as told by your health care provider. This is important. Contact a health care provider if:  Your symptoms do not get better.  Your symptoms return after treatment. This information is not intended to replace advice given to you by your health care provider. Make sure you discuss any questions you have  with your health care provider. Document Released: 07/25/2015 Document Revised: 09/08/2015 Document Reviewed: 04/14/2014  2017 Elsevier  Hypertension Hypertension is another name for high blood pressure. High blood pressure forces your heart to work harder to pump blood. A blood pressure reading has two numbers, which includes a higher number over a lower number (example: 110/72). Follow these  instructions at home:  Have your blood pressure rechecked by your doctor.  Only take medicine as told by your doctor. Follow the directions carefully. The medicine does not work as well if you skip doses. Skipping doses also puts you at risk for problems.  Do not smoke.  Monitor your blood pressure at home as told by your doctor. Contact a doctor if:  You think you are having a reaction to the medicine you are taking.  You have repeat headaches or feel dizzy.  You have puffiness (swelling) in your ankles.  You have trouble with your vision. Get help right away if:  You get a very bad headache and are confused.  You feel weak, numb, or faint.  You get chest or belly (abdominal) pain.  You throw up (vomit).  You cannot breathe very well. This information is not intended to replace advice given to you by your health care provider. Make sure you discuss any questions you have with your health care provider. Document Released: 09/19/2007 Document Revised: 09/08/2015 Document Reviewed: 01/23/2013 Elsevier Interactive Patient Education  2017 [place].  Diarrhea, Adult Introduction Diarrhea is when you have loose and water poop (stool) often. Diarrhea can make you feel weak and cause you to get dehydrated. Dehydration can make you tired and thirsty, make you have a dry mouth, and make it so you pee (urinate) less often. Diarrhea often lasts 2-3 days. However, it can last longer if it is a sign of something more serious. It is important to treat your diarrhea as told by your doctor. Follow these instructions at home: Eating and drinking Follow these recommendations as told by your doctor:  Take an oral rehydration solution (ORS). This is a drink that is sold at pharmacies and stores.  Drink clear fluids, such as:  Water.  Ice chips.  Diluted fruit juice.  Low-calorie sports drinks.  Eat bland, easy-to-digest foods in small amounts as you are able. These foods  include:  Bananas.  Applesauce.  Rice.  Low-fat (lean) meats.  Toast.  Crackers.  Avoid drinking fluids that have a lot of sugar or caffeine in them.  Avoid alcohol.  Avoid spicy or fatty foods. General instructions  Drink enough fluid to keep your pee (urine) clear or pale yellow.  Wash your hands often. If you cannot use soap and water, use hand sanitizer.  Make sure that all people in your home wash their hands well and often.  Take over-the-counter and prescription medicines only as told by your doctor.  Rest at home while you get better.  Watch your condition for any changes.  Take a warm bath to help with any burning or pain from having diarrhea.  Keep all follow-up visits as told by your doctor. This is important. Contact a doctor if:  You have a fever.  Your diarrhea gets worse.  You have new symptoms.  You cannot keep fluids down.  You feel light-headed or dizzy.  You have a headache.  You have muscle cramps. Get help right away if:  You have chest pain.  You feel very weak or you pass out (faint).  You have bloody  or black poop or poop that look like tar.  You have very bad pain, cramping, or bloating in your belly (abdomen).  You have trouble breathing or you are breathing very quickly.  Your heart is beating very quickly.  Your skin feels cold and clammy.  You feel confused.  You have signs of dehydration, such as:  Dark pee, hardly any pee, or no pee.  Cracked lips.  Dry mouth.  Sunken eyes.  Sleepiness.  Weakness. This information is not intended to replace advice given to you by your health care provider. Make sure you discuss any questions you have with your health care provider. Document Released: 09/19/2007 Document Revised: 10/21/2015 Document Reviewed: 12/07/2014  2017 Elsevier

## 2016-06-04 NOTE — Progress Notes (Signed)
S: here for lab results on h pylori, also bp has been elevated, was 170/120 at work, took 2 amlodipine today to = 10mg  ; headache is better today, thinks its the stress at work and waiting for child to be born  O: vitals w elevated bp, lungs c t a, cv rrr  A: htn, h pylori   P: get prev pack, no work until we can lower bp, stay on 2 pills qd

## 2016-06-04 NOTE — Addendum Note (Signed)
Addended by: Versie Starks on: 06/04/2016 08:51 AM   Modules accepted: Orders

## 2016-06-05 LAB — CBC WITH DIFFERENTIAL/PLATELET
Basophils Absolute: 0 x10E3/uL (ref 0.0–0.2)
Basos: 0 %
EOS (ABSOLUTE): 0.1 x10E3/uL (ref 0.0–0.4)
Eos: 2 %
Hematocrit: 48.8 % (ref 37.5–51.0)
Hemoglobin: 16.7 g/dL (ref 13.0–17.7)
Immature Grans (Abs): 0 x10E3/uL (ref 0.0–0.1)
Immature Granulocytes: 1 %
Lymphocytes Absolute: 2.6 x10E3/uL (ref 0.7–3.1)
Lymphs: 35 %
MCH: 30.9 pg (ref 26.6–33.0)
MCHC: 34.2 g/dL (ref 31.5–35.7)
MCV: 90 fL (ref 79–97)
Monocytes Absolute: 0.5 x10E3/uL (ref 0.1–0.9)
Monocytes: 7 %
Neutrophils Absolute: 4.1 x10E3/uL (ref 1.4–7.0)
Neutrophils: 55 %
Platelets: 230 x10E3/uL (ref 150–379)
RBC: 5.4 x10E6/uL (ref 4.14–5.80)
RDW: 13.6 % (ref 12.3–15.4)
WBC: 7.3 x10E3/uL (ref 3.4–10.8)

## 2016-06-11 ENCOUNTER — Ambulatory Visit: Payer: Self-pay | Admitting: Physician Assistant

## 2016-06-11 ENCOUNTER — Encounter: Payer: Self-pay | Admitting: Physician Assistant

## 2016-06-11 VITALS — BP 160/95 | HR 106 | Temp 98.1°F

## 2016-06-11 DIAGNOSIS — I10 Essential (primary) hypertension: Secondary | ICD-10-CM

## 2016-06-11 DIAGNOSIS — M549 Dorsalgia, unspecified: Secondary | ICD-10-CM

## 2016-06-11 MED ORDER — LOSARTAN POTASSIUM 50 MG PO TABS: 50.0000 mg | ORAL_TABLET | Freq: Every day | ORAL | 3 refills | Status: DC

## 2016-06-11 NOTE — Progress Notes (Signed)
S: here for bp recheck, bp still elevated, some chest discomfort, thinks its coming from his back and radiating through to his chest, no n/v/sweating at the time, sx for 3 days, did plant bushes over the weekend  O: vitals w bp at 160/99, 180/100 on recheck, lungs c t a, cv rrr  A: htn  P: trial of losarrten 50mg , stop amlodipine as it has not lowered the bp at all, if bp reading is improved on Friday, may return to work

## 2016-06-15 ENCOUNTER — Ambulatory Visit: Payer: Self-pay | Admitting: Physician Assistant

## 2016-06-15 ENCOUNTER — Encounter: Payer: Self-pay | Admitting: Physician Assistant

## 2016-06-15 VITALS — BP 159/90 | HR 99

## 2016-06-15 DIAGNOSIS — I10 Essential (primary) hypertension: Secondary | ICD-10-CM

## 2016-06-15 NOTE — Progress Notes (Signed)
   Subjective:Hypertension    Patient ID: Edward Day, male    DOB: 08/16/1985, 31 y.o.   MRN: [id number]  HPI Patient for follow up elevated BP s/p starting Losartan  4 days ago.    Review of Systems Obesity and HTN    Objective:   Physical Exam  BP today 159/94.      Assessment & Plan:Hypertension  Continue new medication and have 3 day BP check next week.

## 2016-07-02 ENCOUNTER — Other Ambulatory Visit: Payer: Self-pay | Admitting: Physician Assistant

## 2016-07-02 ENCOUNTER — Ambulatory Visit: Payer: PRIVATE HEALTH INSURANCE | Admitting: Gastroenterology

## 2016-07-02 NOTE — Telephone Encounter (Signed)
Med refill for clomid approved

## 2016-07-19 ENCOUNTER — Ambulatory Visit: Payer: Self-pay | Admitting: Physician Assistant

## 2016-07-19 ENCOUNTER — Encounter: Payer: Self-pay | Admitting: Physician Assistant

## 2016-07-19 VITALS — BP 160/100 | HR 98

## 2016-07-19 DIAGNOSIS — L03012 Cellulitis of left finger: Secondary | ICD-10-CM

## 2016-07-19 MED ORDER — MUPIROCIN 2 % EX OINT: 1.0000 "application " | TOPICAL_OINTMENT | Freq: Two times a day (BID) | CUTANEOUS | 0 refills | Status: DC

## 2016-07-19 NOTE — Progress Notes (Signed)
S: c/o red area at thumb, sore, near cuticle, did get some drainage out of it, no fever/chills  O: vitals wnl, nad, skin with small scab on left thumb by cuticle, area is tender, a little red, n/v intact  A: paronychia  P: bactroban ointment, warm water soaks

## 2016-08-23 ENCOUNTER — Encounter: Payer: Self-pay | Admitting: Gastroenterology

## 2016-08-23 ENCOUNTER — Other Ambulatory Visit: Payer: Self-pay

## 2016-08-23 ENCOUNTER — Ambulatory Visit (INDEPENDENT_AMBULATORY_CARE_PROVIDER_SITE_OTHER): Payer: Managed Care, Other (non HMO) | Admitting: Gastroenterology

## 2016-08-23 VITALS — BP 160/80 | HR 98 | Temp 97.6°F | Ht 73.0 in | Wt 333.0 lb

## 2016-08-23 DIAGNOSIS — R197 Diarrhea, unspecified: Secondary | ICD-10-CM

## 2016-08-23 DIAGNOSIS — A048 Other specified bacterial intestinal infections: Secondary | ICD-10-CM

## 2016-08-23 DIAGNOSIS — R140 Abdominal distension (gaseous): Secondary | ICD-10-CM

## 2016-08-23 NOTE — Progress Notes (Signed)
Gastroenterology Consultation  Referring Provider:     Versie Starks, PA-C Primary Care Physician:  Versie Starks, PA-C Primary Gastroenterologist:  Dr. Allen Norris     Reason for Consultation:     Diarrhea        HPI:   Edward Day is a 31 y.o. y/o male referred for consultation & management of Diarrhea by Dr. Caryn Section, Linden Dolin, PA-C.  This patient comes today after being found to have H. pylori. The patient was given antibiotics by his primary care provider for the H. pylori but was sent to be evaluated because of chronic diarrhea. The patient reports that he has diarrhea 5 times a day for many years. He denies any unexplained weight loss and states that he has gained weight since the birth of his child. The patient also reports that he has not had any rectal bleeding with the diarrhea. The diarrhea does not wake him up from a sleep. The patient has tried to avoid milk products for a prolonged period of time without any leaf of his diarrhea. He also reports that he has a lot of gas. The patient does not chew gum but he states he eats rather quickly but does not drink carbonated drinks.  Past Medical History:  Diagnosis Date  . Polycythemia     Past Surgical History:  Procedure Laterality Date  . APPENDECTOMY  2003  . ELBOW SURGERY  2014  . FINGER SURGERY Right 2003  . SHOULDER SURGERY  2009, 2013, 2013, 2014    Prior to Admission medications   Medication Sig Start Date End Date Taking? Authorizing Provider  clomiPHENE (CLOMID) 50 MG tablet TAKE 1 TABLET (50 MG TOTAL) BY MOUTH EVERY OTHER DAY. 07/02/16   Caryn Section Linden Dolin, PA-C  fluticasone [facility]) 50 MCG/ACT nasal spray SPRAY 2 SPRAYS IN [facility] NOSTRIL ONCE A DAY 06/06/15   Caryn Section Linden Dolin, PA-C  losartan (COZAAR) 50 MG tablet Take 1 tablet (50 mg total) by mouth daily. 06/11/16   Caryn Section Linden Dolin, PA-C  mupirocin ointment (BACTROBAN) 2 % Apply 1 application topically 2 (two) times daily. 07/19/16   Fisher, Linden Dolin, PA-C  pantoprazole  (PROTONIX) 40 MG tablet Take 1 tablet (40 mg total) by mouth daily. 05/31/16   Caryn Section Linden Dolin, PA-C  traZODone (DESYREL) 150 MG tablet TAKE 1 TABLET (150 MG TOTAL) BY MOUTH AT BEDTIME AS NEEDED FOR SLEEP. 12/23/15   Caryn Section Linden Dolin, PA-C    Family History  Problem Relation Age of Onset  . Diabetes Father   . Hypertension Mother      Social History  Substance Use Topics  . Smoking status: Current Every Day Smoker    Packs/day: 0.50    Years: 15.00    Types: Cigarettes  . Smokeless tobacco: Current User    Types: Snuff  . Alcohol use 0.0 oz/week     Comment: 4/week    Allergies as of 08/23/2016  . (No Known Allergies)    Review of Systems:    All systems reviewed and negative except where noted in HPI.   Physical Exam:  There were no vitals taken for this visit. No LMP for male patient. Psych:  Alert and cooperative. Normal mood and affect. General:   Alert,  Well-developed, well-nourished, pleasant and cooperative in NAD Head:  Normocephalic and atraumatic. Eyes:  Sclera clear, no icterus.   Conjunctiva pink. Ears:  Normal auditory acuity. Nose:  No deformity, discharge, or lesions. Mouth:  No deformity or lesions,oropharynx  pink & moist. Neck:  Supple; no masses or thyromegaly. Lungs:  Respirations even and unlabored.  Clear throughout to auscultation.   No wheezes, crackles, or rhonchi. No acute distress. Heart:  Regular rate and rhythm; no murmurs, clicks, rubs, or gallops. Abdomen:  Normal bowel sounds.  No bruits.  Soft, non-tender and non-distended without masses, hepatosplenomegaly or hernias noted.  No guarding or rebound tenderness.  Negative Carnett sign.   Rectal:  Deferred.  Msk:  Symmetrical without gross deformities.  Good, equal movement & strength bilaterally. Pulses:  Normal pulses noted. Extremities:  No clubbing or edema.  No cyanosis. Neurologic:  Alert and oriented x3;  grossly normal neurologically. Skin:  Intact without significant lesions or  rashes.  No jaundice. Lymph Nodes:  No significant cervical adenopathy. Psych:  Alert and cooperative. Normal mood and affect.  Imaging Studies: No results found.  Assessment and Plan:   Edward Day is a 31 y.o. y/o male who comes in today with a chronic history of diarrhea without blood. The patient will be started on over-the-counter Imodium to see if this helps keep his diarrhea under control. The patient will also be set up for colonoscopy to rule out any colitis as the cause of his symptoms.The patient will also have his stool sent off for H. pylori. I have discussed risks & benefits which include, but are not limited to, bleeding, infection, perforation & drug reaction.  The patient agrees with this plan & written consent will be obtained.       Lucilla Lame, MD. Marval Regal   Note: This dictation was prepared with Dragon dictation along with smaller phrase technology. Any transcriptional errors that result from this process are unintentional.

## 2016-08-24 ENCOUNTER — Other Ambulatory Visit: Payer: Self-pay

## 2016-08-24 DIAGNOSIS — R197 Diarrhea, unspecified: Secondary | ICD-10-CM

## 2016-08-27 ENCOUNTER — Telehealth: Payer: Self-pay | Admitting: Gastroenterology

## 2016-08-27 NOTE — Telephone Encounter (Signed)
08/27/16 Called [employer] automated system and NO prior auth required for Colonoscopy 45378 / Diarrhea R19.7 Confirmation #:[id number].

## 2016-08-29 ENCOUNTER — Other Ambulatory Visit: Payer: Self-pay | Admitting: Physician Assistant

## 2016-08-29 NOTE — Telephone Encounter (Signed)
Med refill for trazadone, pt has difficulty with insomnia

## 2016-09-06 ENCOUNTER — Encounter: Payer: Self-pay | Admitting: Physician Assistant

## 2016-09-06 ENCOUNTER — Ambulatory Visit: Payer: Self-pay | Admitting: Physician Assistant

## 2016-09-06 VITALS — BP 159/90 | HR 98 | Temp 98.5°F | Ht 73.0 in | Wt 323.0 lb

## 2016-09-06 DIAGNOSIS — Z0000 Encounter for general adult medical examination without abnormal findings: Secondary | ICD-10-CM

## 2016-09-06 DIAGNOSIS — I10 Essential (primary) hypertension: Secondary | ICD-10-CM

## 2016-09-06 DIAGNOSIS — Z0189 Encounter for other specified special examinations: Secondary | ICD-10-CM

## 2016-09-06 DIAGNOSIS — Z008 Encounter for other general examination: Secondary | ICD-10-CM

## 2016-09-06 DIAGNOSIS — R7989 Other specified abnormal findings of blood chemistry: Secondary | ICD-10-CM

## 2016-09-06 MED ORDER — CLOMIPHENE CITRATE 50 MG PO TABS: 50.0000 mg | ORAL_TABLET | ORAL | 3 refills | Status: DC

## 2016-09-06 MED ORDER — PANTOPRAZOLE SODIUM 40 MG PO TBEC: 40.0000 mg | DELAYED_RELEASE_TABLET | Freq: Every day | ORAL | 3 refills | Status: DC

## 2016-09-06 MED ORDER — LOSARTAN POTASSIUM 50 MG PO TABS: 50.0000 mg | ORAL_TABLET | Freq: Every day | ORAL | 3 refills | Status: DC

## 2016-09-06 NOTE — Progress Notes (Signed)
S: pt here for wellness physical and biometrics for insurance purposes, no complaints ros neg. Has colonoscopy scheduled for June 12 due to chronic diarrhea; PMH:  As noted on chart  Social: smoker but has cut back to one cigarette q 6 days since his son was born, +etoh about 6 beers a week, no drugs  Fam: as noted on chart  O: vitals wnl, nad, ENT wnl, neck supple no lymph, lungs c t a, cv rrr, abd soft nontender bs normal all 4 quads  A: wellness, biometric physical, htn, low t, obesity  P: sent refills on meds to [facility] at pt request, pt to work on diet/exercise/weight loss, continue to work on smoking cessation, cut back on etoh, nonfasting labs today as pt had cheese this morning

## 2016-09-09 ENCOUNTER — Emergency Department
Admission: EM | Admit: 2016-09-09 | Discharge: 2016-09-09 | Disposition: A | Discharge status: Home / Self Care | Payer: Worker's Compensation | Attending: Emergency Medicine | Admitting: Emergency Medicine

## 2016-09-09 ENCOUNTER — Encounter: Payer: Self-pay | Admitting: Emergency Medicine

## 2016-09-09 ENCOUNTER — Emergency Department: Payer: Worker's Compensation

## 2016-09-09 DIAGNOSIS — M25561 Pain in right knee: Secondary | ICD-10-CM

## 2016-09-09 DIAGNOSIS — F17210 Nicotine dependence, cigarettes, uncomplicated: Secondary | ICD-10-CM | POA: Diagnosis not present

## 2016-09-09 DIAGNOSIS — I10 Essential (primary) hypertension: Secondary | ICD-10-CM | POA: Diagnosis not present

## 2016-09-09 DIAGNOSIS — F17290 Nicotine dependence, other tobacco product, uncomplicated: Secondary | ICD-10-CM | POA: Diagnosis not present

## 2016-09-09 DIAGNOSIS — Y999 Unspecified external cause status: Secondary | ICD-10-CM | POA: Diagnosis not present

## 2016-09-09 DIAGNOSIS — Y9389 Activity, other specified: Secondary | ICD-10-CM | POA: Diagnosis not present

## 2016-09-09 DIAGNOSIS — S8991XA Unspecified injury of right lower leg, initial encounter: Secondary | ICD-10-CM | POA: Diagnosis present

## 2016-09-09 DIAGNOSIS — Z79899 Other long term (current) drug therapy: Secondary | ICD-10-CM | POA: Diagnosis not present

## 2016-09-09 DIAGNOSIS — X501XXA Overexertion from prolonged static or awkward postures, initial encounter: Secondary | ICD-10-CM | POA: Insufficient documentation

## 2016-09-09 DIAGNOSIS — S8391XA Sprain of unspecified site of right knee, initial encounter: Secondary | ICD-10-CM | POA: Insufficient documentation

## 2016-09-09 DIAGNOSIS — Y929 Unspecified place or not applicable: Secondary | ICD-10-CM | POA: Diagnosis not present

## 2016-09-09 MED ORDER — LIDOCAINE 5 % EX PTCH
1.0000 | MEDICATED_PATCH | CUTANEOUS | Status: DC
  Administered 2016-09-09: 1 via TRANSDERMAL
  Filled 2016-09-09: qty 1

## 2016-09-09 MED ORDER — TRAMADOL HCL 50 MG PO TABS: 50.0000 mg | ORAL_TABLET | Freq: Four times a day (QID) | ORAL | 0 refills | Status: DC | PRN

## 2016-09-09 MED ORDER — MELOXICAM 15 MG PO TABS: 15.0000 mg | ORAL_TABLET | Freq: Every day | ORAL | 0 refills | Status: DC

## 2016-09-09 MED ORDER — KETOROLAC TROMETHAMINE 60 MG/2ML IM SOLN
60.0000 mg | Freq: Once | INTRAMUSCULAR | Status: AC
  Administered 2016-09-09: 60 mg via INTRAMUSCULAR
  Filled 2016-09-09: qty 2

## 2016-09-09 MED ORDER — OXYCODONE-ACETAMINOPHEN 5-325 MG PO TABS
1.0000 | ORAL_TABLET | Freq: Once | ORAL | Status: AC
  Administered 2016-09-09: 1 via ORAL
  Filled 2016-09-09: qty 1
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
## 2016-09-09 NOTE — ED Notes (Signed)
Per Lt. Ralph Dowdy patient does not need UDS or breathalyzer for workers comp.

## 2016-09-09 NOTE — Discharge Instructions (Signed)
Please follow up with orthopedic surgery for further evaluation of this knee pain. Please continue to use crutches and ice and elevate her right knee.
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
## 2016-09-09 NOTE — ED Notes (Signed)
Pt. Going home with friend 

## 2016-09-19 LAB — CMP12+LP+TP+TSH+6AC+PSA+CBC…
A/G RATIO: 1.7 (ref 1.2–2.2)
ALBUMIN: 4.6 g/dL (ref 3.5–5.5)
ALT: 43 IU/L (ref 0–44)
AST: 36 IU/L (ref 0–40)
Alkaline Phosphatase: 68 IU/L (ref 39–117)
BASOS: 0 %
BUN/Creatinine Ratio: 18 (ref 9–20)
BUN: 15 mg/dL (ref 6–20)
Basophils Absolute: 0 10*3/uL (ref 0.0–0.2)
Bilirubin Total: 0.4 mg/dL (ref 0.0–1.2)
CALCIUM: 9.6 mg/dL (ref 8.7–10.2)
CHOLESTEROL TOTAL: 224 mg/dL — AB (ref 100–199)
Chloride: 106 mmol/L (ref 96–106)
Chol/HDL Ratio: 5.1 ratio — ABNORMAL HIGH (ref 0.0–5.0)
Creatinine, Ser: 0.84 mg/dL (ref 0.76–1.27)
EOS (ABSOLUTE): 0.1 10*3/uL (ref 0.0–0.4)
Eos: 1 %
Estimated CHD Risk: 1.1 times avg. — ABNORMAL HIGH (ref 0.0–1.0)
FREE THYROXINE INDEX: 1.9 (ref 1.2–4.9)
GFR calc Af Amer: 134 mL/min/{1.73_m2} (ref >59)
GFR calc non Af Amer: 116 mL/min/{1.73_m2} (ref >59)
GGT: 38 IU/L (ref 0–65)
GLOBULIN, TOTAL: 2.7 g/dL (ref 1.5–4.5)
Glucose: 99 mg/dL (ref 65–99)
HDL: 44 mg/dL (ref >39)
HEMATOCRIT: 46.5 % (ref 37.5–51.0)
Hemoglobin: 16 g/dL (ref 13.0–17.7)
IMMATURE GRANULOCYTES: 0 %
IRON: 99 ug/dL (ref 38–169)
Immature Grans (Abs): 0 10*3/uL (ref 0.0–0.1)
LDH: 191 IU/L (ref 121–224)
LDL Calculated: 153 mg/dL — ABNORMAL HIGH (ref 0–99)
LYMPHS ABS: 2.7 10*3/uL (ref 0.7–3.1)
Lymphs: 29 %
MCH: 30.9 pg (ref 26.6–33.0)
MCHC: 34.4 g/dL (ref 31.5–35.7)
MCV: 90 fL (ref 79–97)
MONOS ABS: 0.6 10*3/uL (ref 0.1–0.9)
Monocytes: 6 %
NEUTROS PCT: 64 %
Neutrophils Absolute: 5.8 10*3/uL (ref 1.4–7.0)
PHOSPHORUS: 3.9 mg/dL (ref 2.5–4.5)
PLATELETS: 240 10*3/uL (ref 150–379)
PROSTATE SPECIFIC AG, SERUM: 0.4 ng/mL (ref 0.0–4.0)
Potassium: 4.3 mmol/L (ref 3.5–5.2)
RBC: 5.18 x10E6/uL (ref 4.14–5.80)
RDW: 13.8 % (ref 12.3–15.4)
SODIUM: 148 mmol/L — AB (ref 134–144)
T3 UPTAKE RATIO: 25 % (ref 24–39)
T4 TOTAL: 7.6 ug/dL (ref 4.5–12.0)
TRIGLYCERIDES: 133 mg/dL (ref 0–149)
TSH: 0.823 u[IU]/mL (ref 0.450–4.500)
Total Protein: 7.3 g/dL (ref 6.0–8.5)
Uric Acid: 8.4 mg/dL (ref 3.7–8.6)
VLDL Cholesterol Cal: 27 mg/dL (ref 5–40)
WBC: 9.3 10*3/uL (ref 3.4–10.8)

## 2016-09-19 LAB — HEPATITIS C ANTIBODY (REFLEX)

## 2016-09-19 LAB — TESTOSTERONE,FREE AND TOTAL: TESTOSTERONE: 322 ng/dL (ref 264–916)

## 2016-09-19 LAB — VITAMIN D 25 HYDROXY (VIT D DEFICIENCY, FRACTURES): Vit D, 25-Hydroxy: 28.8 ng/mL — ABNORMAL LOW (ref 30.0–100.0)

## 2016-09-19 LAB — HGB A1C W/O EAG: HEMOGLOBIN A1C: 5.4 % (ref 4.8–5.6)

## 2016-09-24 ENCOUNTER — Ambulatory Visit: Payer: Self-pay | Admitting: Physician Assistant

## 2016-09-25 ENCOUNTER — Encounter: Admission: RE | Payer: Self-pay | Source: Ambulatory Visit

## 2016-09-25 ENCOUNTER — Ambulatory Visit
Admission: RE | Admit: 2016-09-25 | Payer: Managed Care, Other (non HMO) | Source: Ambulatory Visit | Admitting: Gastroenterology

## 2016-09-25 SURGERY — COLONOSCOPY WITH PROPOFOL
Anesthesia: General

## 2016-10-05 ENCOUNTER — Other Ambulatory Visit: Payer: Self-pay | Admitting: Physician Assistant

## 2016-10-15 ENCOUNTER — Telehealth: Payer: Self-pay | Admitting: Emergency Medicine

## 2016-10-15 NOTE — Telephone Encounter (Signed)
I received a call from [place] at [facility].  She expressed that the patient is scheduled to have surgery on Thursday and they are needing his recent visit notes and lab results. Information was faxed to [phone]. I received a confirmation that the fax was successful.

## 2016-11-23 ENCOUNTER — Emergency Department (HOSPITAL_COMMUNITY): Payer: Managed Care, Other (non HMO)

## 2016-11-23 ENCOUNTER — Observation Stay (HOSPITAL_COMMUNITY)
Admission: EM | Admit: 2016-11-23 | Discharge: 2016-11-24 | Disposition: A | Discharge status: Home / Self Care | Payer: Managed Care, Other (non HMO) | Attending: Internal Medicine | Admitting: Internal Medicine

## 2016-11-23 ENCOUNTER — Encounter (HOSPITAL_COMMUNITY): Payer: Self-pay

## 2016-11-23 ENCOUNTER — Emergency Department (HOSPITAL_BASED_OUTPATIENT_CLINIC_OR_DEPARTMENT_OTHER)
Admit: 2016-11-23 | Discharge: 2016-11-23 | Disposition: A | Discharge status: Home / Self Care | Payer: Managed Care, Other (non HMO)

## 2016-11-23 DIAGNOSIS — Z91018 Allergy to other foods: Secondary | ICD-10-CM | POA: Diagnosis not present

## 2016-11-23 DIAGNOSIS — Z6841 Body Mass Index (BMI) 40.0 and over, adult: Secondary | ICD-10-CM | POA: Diagnosis not present

## 2016-11-23 DIAGNOSIS — Z820 Family history of epilepsy and other diseases of the nervous system: Secondary | ICD-10-CM | POA: Diagnosis not present

## 2016-11-23 DIAGNOSIS — Z8619 Personal history of other infectious and parasitic diseases: Secondary | ICD-10-CM | POA: Diagnosis not present

## 2016-11-23 DIAGNOSIS — I10 Essential (primary) hypertension: Secondary | ICD-10-CM | POA: Diagnosis not present

## 2016-11-23 DIAGNOSIS — Z833 Family history of diabetes mellitus: Secondary | ICD-10-CM | POA: Diagnosis not present

## 2016-11-23 DIAGNOSIS — M79609 Pain in unspecified limb: Secondary | ICD-10-CM

## 2016-11-23 DIAGNOSIS — G43009 Migraine without aura, not intractable, without status migrainosus: Secondary | ICD-10-CM | POA: Diagnosis not present

## 2016-11-23 DIAGNOSIS — E291 Testicular hypofunction: Secondary | ICD-10-CM | POA: Insufficient documentation

## 2016-11-23 DIAGNOSIS — K219 Gastro-esophageal reflux disease without esophagitis: Secondary | ICD-10-CM | POA: Diagnosis not present

## 2016-11-23 DIAGNOSIS — D45 Polycythemia vera: Secondary | ICD-10-CM | POA: Insufficient documentation

## 2016-11-23 DIAGNOSIS — E669 Obesity, unspecified: Secondary | ICD-10-CM | POA: Diagnosis not present

## 2016-11-23 DIAGNOSIS — Z7901 Long term (current) use of anticoagulants: Secondary | ICD-10-CM | POA: Diagnosis not present

## 2016-11-23 DIAGNOSIS — Z803 Family history of malignant neoplasm of breast: Secondary | ICD-10-CM | POA: Insufficient documentation

## 2016-11-23 DIAGNOSIS — Z8249 Family history of ischemic heart disease and other diseases of the circulatory system: Secondary | ICD-10-CM | POA: Insufficient documentation

## 2016-11-23 DIAGNOSIS — I82431 Acute embolism and thrombosis of right popliteal vein: Secondary | ICD-10-CM | POA: Insufficient documentation

## 2016-11-23 DIAGNOSIS — I2699 Other pulmonary embolism without acute cor pulmonale: Secondary | ICD-10-CM

## 2016-11-23 DIAGNOSIS — M7989 Other specified soft tissue disorders: Secondary | ICD-10-CM | POA: Diagnosis not present

## 2016-11-23 DIAGNOSIS — Z79899 Other long term (current) drug therapy: Secondary | ICD-10-CM | POA: Insufficient documentation

## 2016-11-23 DIAGNOSIS — Z832 Family history of diseases of the blood and blood-forming organs and certain disorders involving the immune mechanism: Secondary | ICD-10-CM | POA: Insufficient documentation

## 2016-11-23 DIAGNOSIS — R635 Abnormal weight gain: Secondary | ICD-10-CM | POA: Diagnosis present

## 2016-11-23 DIAGNOSIS — K529 Noninfective gastroenteritis and colitis, unspecified: Secondary | ICD-10-CM | POA: Diagnosis not present

## 2016-11-23 DIAGNOSIS — R0602 Shortness of breath: Secondary | ICD-10-CM | POA: Diagnosis not present

## 2016-11-23 DIAGNOSIS — F17210 Nicotine dependence, cigarettes, uncomplicated: Secondary | ICD-10-CM | POA: Insufficient documentation

## 2016-11-23 DIAGNOSIS — I824Z1 Acute embolism and thrombosis of unspecified deep veins of right distal lower extremity: Secondary | ICD-10-CM | POA: Diagnosis not present

## 2016-11-23 HISTORY — DX: Other pulmonary embolism without acute cor pulmonale: I26.99

## 2016-11-23 LAB — PROTIME-INR
INR: 0.97
PROTHROMBIN TIME: 12.9 s (ref 11.4–15.2)

## 2016-11-23 LAB — BASIC METABOLIC PANEL
Anion gap: 10 (ref 5–15)
BUN: 12 mg/dL (ref 6–20)
CALCIUM: 9.1 mg/dL (ref 8.9–10.3)
CO2: 26 mmol/L (ref 22–32)
CREATININE: 0.88 mg/dL (ref 0.61–1.24)
Chloride: 106 mmol/L (ref 101–111)
Glucose, Bld: 92 mg/dL (ref 65–99)
Potassium: 4 mmol/L (ref 3.5–5.1)
Sodium: 142 mmol/L (ref 135–145)

## 2016-11-23 LAB — CBC WITH DIFFERENTIAL/PLATELET
BASOS ABS: 0 10*3/uL (ref 0.0–0.1)
BASOS PCT: 0 %
EOS ABS: 0 10*3/uL (ref 0.0–0.7)
Eosinophils Relative: 1 %
HCT: 44.1 % (ref 39.0–52.0)
Hemoglobin: 15.4 g/dL (ref 13.0–17.0)
Lymphocytes Relative: 28 %
Lymphs Abs: 2.1 10*3/uL (ref 0.7–4.0)
MCH: 30.7 pg (ref 26.0–34.0)
MCHC: 34.9 g/dL (ref 30.0–36.0)
MCV: 88 fL (ref 78.0–100.0)
MONO ABS: 0.8 10*3/uL (ref 0.1–1.0)
MONOS PCT: 10 %
NEUTROS PCT: 61 %
Neutro Abs: 4.7 10*3/uL (ref 1.7–7.7)
PLATELETS: 170 10*3/uL (ref 150–400)
RBC: 5.01 MIL/uL (ref 4.22–5.81)
RDW: 12.9 % (ref 11.5–15.5)
WBC: 7.6 10*3/uL (ref 4.0–10.5)

## 2016-11-23 LAB — TROPONIN I

## 2016-11-23 MED ORDER — SODIUM CHLORIDE 0.9 % IV SOLN
INTRAVENOUS | Status: DC
  Administered 2016-11-23 – 2016-11-24 (×2): via INTRAVENOUS

## 2016-11-23 MED ORDER — ENOXAPARIN SODIUM 150 MG/ML ~~LOC~~ SOLN
140.0000 mg | Freq: Two times a day (BID) | SUBCUTANEOUS | Status: DC
  Filled 2016-11-23: qty 0.93

## 2016-11-23 MED ORDER — TRAZODONE HCL 100 MG PO TABS: 150.0000 mg | ORAL_TABLET | Freq: Every evening | ORAL | Status: DC | PRN

## 2016-11-23 MED ORDER — LOSARTAN POTASSIUM 50 MG PO TABS
50.0000 mg | ORAL_TABLET | Freq: Every day | ORAL | Status: DC
  Administered 2016-11-24: 50 mg via ORAL
  Filled 2016-11-23: qty 1

## 2016-11-23 MED ORDER — ACETAMINOPHEN 325 MG PO TABS
650.0000 mg | ORAL_TABLET | Freq: Four times a day (QID) | ORAL | Status: DC | PRN
  Administered 2016-11-24: 650 mg via ORAL
  Filled 2016-11-23: qty 2

## 2016-11-23 MED ORDER — ACETAMINOPHEN 650 MG RE SUPP: 650.0000 mg | Freq: Four times a day (QID) | RECTAL | Status: DC | PRN

## 2016-11-23 MED ORDER — HYDROCODONE-ACETAMINOPHEN 5-325 MG PO TABS
1.0000 | ORAL_TABLET | Freq: Four times a day (QID) | ORAL | Status: DC | PRN
  Administered 2016-11-23 – 2016-11-24 (×3): 2 via ORAL
  Filled 2016-11-23 (×3): qty 2

## 2016-11-23 MED ORDER — HYDRALAZINE HCL 20 MG/ML IJ SOLN
5.0000 mg | Freq: Four times a day (QID) | INTRAMUSCULAR | Status: DC | PRN
  Filled 2016-11-23: qty 0.25

## 2016-11-23 MED ORDER — IOPAMIDOL (ISOVUE-370) INJECTION 76%
INTRAVENOUS | Status: AC
  Filled 2016-11-23: qty 100

## 2016-11-23 MED ORDER — ALBUTEROL SULFATE (2.5 MG/3ML) 0.083% IN NEBU: 2.5000 mg | INHALATION_SOLUTION | RESPIRATORY_TRACT | Status: DC | PRN

## 2016-11-23 MED ORDER — SODIUM CHLORIDE 0.9 % IV BOLUS (SEPSIS)
500.0000 mL | Freq: Once | INTRAVENOUS | Status: AC
  Administered 2016-11-23: 500 mL via INTRAVENOUS

## 2016-11-23 MED ORDER — IOPAMIDOL (ISOVUE-370) INJECTION 76%
100.0000 mL | Freq: Once | INTRAVENOUS | Status: AC | PRN
Start: 2016-11-23 — End: 2016-11-23
  Administered 2016-11-23: 100 mL via INTRAVENOUS

## 2016-11-23 MED ORDER — PANTOPRAZOLE SODIUM 40 MG PO TBEC
40.0000 mg | DELAYED_RELEASE_TABLET | Freq: Every day | ORAL | Status: DC
Start: 2016-11-24 — End: 2016-11-24
  Administered 2016-11-24: 40 mg via ORAL
  Filled 2016-11-23: qty 1

## 2016-11-23 MED ORDER — ENOXAPARIN SODIUM 150 MG/ML ~~LOC~~ SOLN
140.0000 mg | SUBCUTANEOUS | Status: AC
  Administered 2016-11-23: 140 mg via SUBCUTANEOUS
  Filled 2016-11-23: qty 0.93

## 2016-11-23 NOTE — ED Notes (Signed)
Patient transported to CT 

## 2016-11-23 NOTE — ED Triage Notes (Signed)
Pt eports recent right knee surgery, report pain , calf edema , shortness of breath. Sent by physical therapy for possible DVT. Pain and shortness of breath started 11/19/16/ .

## 2016-11-23 NOTE — ED Notes (Signed)
IV attempted x2 without success.

## 2016-11-23 NOTE — Progress Notes (Addendum)
ANTICOAGULATION CONSULT NOTE - Initial Consult  Pharmacy Consult for enoxaparin Indication: pulmonary embolus  Allergies  Allergen Reactions  . Banana Swelling    Patient Measurements:   Heparin Dosing Weight:   Vital Signs: Temp: 98.2 F (36.8 C) (08/10 1105) Temp Source: Oral (08/10 1105) BP: 146/93 (08/10 1337) Pulse Rate: 76 (08/10 1337)  Labs:  Recent Labs  11/23/16 1240  HGB 15.4  HCT 44.1  PLT 170  LABPROT 12.9  INR 0.97  CREATININE 0.88    CrCl cannot be calculated (Unknown ideal weight.).   Medical History: Past Medical History:  Diagnosis Date  . GERD (gastroesophageal reflux disease)   . H. pylori infection   . Hypertension   . Low testosterone   . Polycythemia     Assessment: 16 YOM with presents with DVT of RLE and PE.  ED note states he had recent knee surgery.  Today, 11/23/2016  Need weight - order placed asking ED to enterd  Renal: SCR WNL  CBC: Hgb and Pltc WNL  Goal of Therapy:  Anti-Xa level 0.6-1 units/ml 4hrs after LMWH dose given Monitor platelets by anticoagulation protocol: Yes   Plan:   Enoxaparin 1mg /kg (140mg ) SQ q12h  Monitor CBC q72h if admitted to hospital  Doreene Eland, PharmD, BCPS.   Pager: [phone] 11/23/2016 3:58 PM

## 2016-11-23 NOTE — Progress Notes (Signed)
*  PRELIMINARY RESULTS* Vascular Ultrasound Right lower extremity venous duplex has been completed.  Preliminary findings: DVT noted in the right popliteal vein and posterior tibial veins, also intramuscular thrombosis of the right gastroc and soleal veins.   Results given to Brinkley, [place], RDMS, RVT  11/23/2016, 1:30 PM

## 2016-11-23 NOTE — ED Notes (Signed)
Report time 1710. Pam G receiving nurse. [phone] number to call for report.

## 2016-11-23 NOTE — H&P (Signed)
TRH H&P   Patient Demographics:    Edward Day, is a 31 y.o. male  MRN: [id number]   DOB - 1986/02/15  Admit Date - 11/23/2016  Outpatient Primary MD for the patient is Caryn Section Linden Dolin, PA-C  Referring MD/NP/PA: Hardie Pulley  Patient coming from: Home  Chief Complaint  Patient presents with  . possible DVT  . Shortness of Breath      HPI:    Edward Day  is a 30 y.o. 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, required phlebotomy 2 years ago, resolved after stopping testosterone, hypertension, hyperlipidemia, obesity, quit smoking 4 months ago, patient status post recent right knee anterior cruciate ligament reconstruction 10/18/2016, presents with complaints of right lower extremity swelling, tenderness, shortness of breath, chest pain, reports he had been having dyspnea for last 5 days, and pleuritic chest pain, and over the last 48 hours his leg started to become more swollen and tender which prompted him to come to ED, NAD venous Doppler significant for DVT, and CTA chest significant for right lung PE, patient was started on Lovenox, he was not hypoxic, not tachycardic, no troponins done in ED, PA with no evidence of right heart strain, patient was started on Lovenox , patient reports his been using Clomid for few month for energy boost, denies any family history of DVT, reports mother history is significant for polycythemia.    Review of systems:    In addition to the HPI above,  No Fever-chills, No Headache, No changes with Vision or hearing, No problems swallowing food or Liquids, Ports chest pain and shortness of breath, no cough, no productive sputum No Abdominal pain, No Nausea or Vommitting, Bowel movements are regular, No Blood in stool or Urine, No dysuria, No new skin rashes or bruises, No new joints pains-aches, and planes of  pain in right calf area No new weakness, tingling, numbness in any extremity, No recent weight gain or loss, No polyuria, polydypsia or polyphagia, No significant Mental Stressors.  A full 10 point Review of Systems was done, except as stated above, all other Review of Systems were negative.   With Past History of the following :    Past Medical History:  Diagnosis Date  . GERD (gastroesophageal reflux disease)   . H. pylori infection   . Hypertension   . Low testosterone   . Polycythemia       Past Surgical History:  Procedure Laterality Date  . APPENDECTOMY  2003  . ELBOW SURGERY  2014  . FINGER SURGERY Right 2003  . KNEE SURGERY    . SHOULDER SURGERY  2009, 2013, 2013, 2014  . TONSILLECTOMY        Social History:     Social History  Substance Use Topics  . Smoking status: Current Some Day Smoker    Packs/day: 0.50    Years: 15.00  Types: Cigarettes  . Smokeless tobacco: Current User    Types: Snuff     Comment: has cut back to one cig every 6 days 09/06/16  . Alcohol use 0.0 oz/week     Comment: 4/week     Lives - At home  Mobility - independent   Family History :     Family History  Problem Relation Age of Onset  . Hypertension Mother   . Polycythemia Mother   . Hyperlipidemia Mother   . Diabetes Father   . Hypertension Father   . Cancer Maternal Grandmother        breast cancer  . Dementia Maternal Grandmother   . Diabetes Maternal Grandmother      Home Medications:   Prior to Admission medications   Medication Sig Start Date End Date Taking? Authorizing Provider  clomiPHENE (CLOMID) 50 MG tablet Take 1 tablet (50 mg total) by mouth every other day. 09/06/16  Yes Fisher, Linden Dolin, PA-C  diclofenac sodium (VOLTAREN) 1 % GEL Apply 4 g topically 4 (four) times daily.   Yes [provider]  losartan (COZAAR) 50 MG tablet Take 1 tablet (50 mg total) by mouth daily. 09/06/16  Yes Fisher, Linden Dolin, PA-C  pantoprazole (PROTONIX) 40 MG  tablet Take 1 tablet (40 mg total) by mouth daily. 09/06/16  Yes Fisher, Linden Dolin, PA-C  traZODone (DESYREL) 150 MG tablet TAKE 1 TABLET (150 MG TOTAL) BY MOUTH AT BEDTIME AS NEEDED FOR SLEEP. 08/29/16  Yes Fisher, Linden Dolin, PA-C  fluticasone [facility]) 50 MCG/ACT nasal spray SPRAY 2 SPRAYS IN EACH NOSTRIL ONCE A DAY Patient not taking: Reported on 11/23/2016 06/06/15   Versie Starks, PA-C  meloxicam (MOBIC) 15 MG tablet Take 1 tablet (15 mg total) by mouth daily. Patient not taking: Reported on 11/23/2016 09/09/16 09/09/17  Loney Hering, MD  mupirocin ointment (BACTROBAN) 2 % Apply 1 application topically 2 (two) times daily. Patient not taking: Reported on 08/23/2016 07/19/16   Versie Starks, PA-C  pantoprazole (PROTONIX) 40 MG tablet TAKE 1 TABLET (40 MG TOTAL) BY MOUTH DAILY. Patient not taking: Reported on 11/23/2016 10/05/16   Versie Starks, PA-C  traMADol (ULTRAM) 50 MG tablet Take 1 tablet (50 mg total) by mouth every 6 (six) hours as needed. Patient not taking: Reported on 11/23/2016 09/09/16   Loney Hering, MD     Allergies:     Allergies  Allergen Reactions  . Banana Swelling     Physical Exam:   Vitals  Blood pressure (!) 146/93, pulse 76, temperature 98.2 F (36.8 C), temperature source Oral, resp. rate 20, height 6\' 1"  (1.854 m), weight (!) 142.9 kg (315 lb), SpO2 100 %.   1. General obese male lying in bed in NAD,    2. Normal affect and insight, Not Suicidal or Homicidal, Awake Alert, Oriented X 3.  3. No F.N deficits, ALL C.Nerves Intact, Strength 5/5 all 4 extremities, Sensation intact all 4 extremities, Plantars down going.  4. Ears and Eyes appear Normal, Conjunctivae clear, PERRLA. Moist Oral Mucosa.  5. Supple Neck, No JVD, No cervical lymphadenopathy appriciated, No Carotid Bruits.  6. Symmetrical Chest wall movement, Good air movement bilaterally, CTAB.  7. RRR, No Gallops, Rubs or Murmurs, No Parasternal Heave.  8. Positive Bowel Sounds, Abdomen  Soft, No tenderness, No organomegaly appriciated,No rebound -guarding or rigidity.  9.  No Cyanosis, Normal Skin Turgor, No Skin Rash or Bruise.  10. Good muscle tone, right lower extremity with significant  edema, no cellulitis, has some tenderness.  11. No Palpable Lymph Nodes in Neck or Axillae    Data Review:    CBC  Recent Labs Lab 11/23/16 1240  WBC 7.6  HGB 15.4  HCT 44.1  PLT 170  MCV 88.0  MCH 30.7  MCHC 34.9  RDW 12.9  LYMPHSABS 2.1  MONOABS 0.8  EOSABS 0.0  BASOSABS 0.0   ------------------------------------------------------------------------------------------------------------------  Chemistries   Recent Labs Lab 11/23/16 1240  NA 142  K 4.0  CL 106  CO2 26  GLUCOSE 92  BUN 12  CREATININE 0.88  CALCIUM 9.1   ------------------------------------------------------------------------------------------------------------------ estimated creatinine clearance is 179.1 mL/min (by C-G formula based on SCr of 0.88 mg/dL). ------------------------------------------------------------------------------------------------------------------ No results for input(s): TSH, T4TOTAL, T3FREE, THYROIDAB in the last 72 hours.  Invalid input(s): FREET3  Coagulation profile  Recent Labs Lab 11/23/16 1240  INR 0.97   ------------------------------------------------------------------------------------------------------------------- No results for input(s): DDIMER in the last 72 hours. -------------------------------------------------------------------------------------------------------------------  Cardiac Enzymes No results for input(s): CKMB, TROPONINI, MYOGLOBIN in the last 168 hours.  Invalid input(s): CK ------------------------------------------------------------------------------------------------------------------ No results found for:  BNP   ---------------------------------------------------------------------------------------------------------------  Urinalysis    Component Value Date/Time   COLORURINE Yellow 11/06/2013 1710   APPEARANCEUR Clear 11/06/2013 1710   LABSPEC 1.024 11/06/2013 1710   PHURINE 6.0 11/06/2013 1710   GLUCOSEU Negative 11/06/2013 1710   HGBUR Negative 11/06/2013 1710   BILIRUBINUR Negative 11/06/2013 1710   KETONESUR Negative 11/06/2013 1710   PROTEINUR 100 mg/dL 11/06/2013 1710   NITRITE Negative 11/06/2013 1710   LEUKOCYTESUR Negative 11/06/2013 1710    ----------------------------------------------------------------------------------------------------------------   Imaging Results:    Ct Angio Chest Pe W And/or Wo Contrast  Result Date: 11/23/2016 CLINICAL DATA:  Calf edema and shortness of breath.  Positive DVT. EXAM: CT ANGIOGRAPHY CHEST WITH CONTRAST TECHNIQUE: Multidetector CT imaging of the chest was performed using the standard protocol during bolus administration of intravenous contrast. Multiplanar CT image reconstructions and MIPs were obtained to evaluate the vascular anatomy. CONTRAST:  100 mL Isovue 370 COMPARISON:  Chest CT 11/09/2013 FINDINGS: Cardiovascular: D studies degraded blood poor contrast bolus timing and beam attenuation secondary to patient body habitus.There is a large filling defect within the distal right pulmonary artery that extends into the proximal right upper lobar and right middle lobar arteries. The clot extends more distally in the right lower lobe, to the peripheral segmental level. No central or lobar left-sided pulmonary embolus. Visualization of the more distal left-sided pulmonary arteries is poor. The main pulmonary artery is within normal limits for size. There is no CT evidence of acute right heart strain. The RV to LV ratio is 0.7. The visualized aorta is normal. There is a normal 3-vessel arch branching pattern. Heart size is normal, without  pericardial effusion. Mediastinum/Nodes: No mediastinal, hilar or axillary lymphadenopathy. The visualized thyroid and thoracic esophageal course are unremarkable. Lungs/Pleura: No pulmonary nodules or masses. No pleural effusion or pneumothorax. No focal airspace consolidation. No focal pleural abnormality. Upper Abdomen: Contrast bolus timing is not optimized for evaluation of the abdominal organs. Within this limitation, the visualized organs of the upper abdomen are normal. Musculoskeletal: No chest wall abnormality. No acute or significant osseous findings. Review of the MIP images confirms the above findings. IMPRESSION: 1. Acute pulmonary embolus within the distal right pulmonary artery extending into the right upper and middle lobar branches and into the right lower lobe the distal segmental branches. 2. Poor visualization of the left-sided distal pulmonary arteries due to poor contrast bolus  and beam attenuation by patient body habitus. No central left-sided pulmonary embolus. 3. No evidence of right heart strain. Critical Value/emergent results were called by telephone at the time of interpretation on 11/23/2016 at 4:06 pm to Dr. Carlisle Cater , who verbally acknowledged these results. Electronically Signed   By: Ulyses Jarred M.D.   On: 11/23/2016 16:07    EKG with normal sinus rhythm at 97 bpm, with a QTC of 413, no acute ST or T-wave changes compared to EKG done 15  Assessment & Plan:    Active Problems:   HYPERTENSION, BENIGN ESSENTIAL   WEIGHT GAIN   Pulmonary embolism (HCC)  Right lower extremity acute DVT/PE - There is provoked DVT, given recent knee surgery, as well patient on Clomid which is thrombogenic as well, CT with no evidence of right heart strain, currently no tachycardia, no hypoxia on my physical exam, I will send troponins, admitted to telemetry, he will need anticoagulation for total of 3 months given this is provoked DVT, I have consulted him about stopping Clomid, and all  other forms of anabolic/hormonal supplements, will monitor in telemetry, if he remains stable, I will transition to [place] in a.m. We'll obtain 2-D echo to evaluate for right heart strain .  Obesity  - Was counseled about weight loss   Hypertension - Continue with home medication  History of previous ischemia - This is most likely secondary to polycystic ischemia secondary to testosterone use, and history of smoking in the past, been within normal range  DVT Prophylaxis Lovenox treatment dose   AM Labs Ordered, also please review Full Orders  Family Communication: Admission, patients condition and plan of care including tests being ordered have been discussed with the patient who indicate understanding and agree with the plan and Code Status.  Code Status Full  Likely DC to  Home  Condition GUARDED    Consults called: None  Admission status: Observation   Time spent in minutes : 55 minutes   ELGERGAWY, DAWOOD M.D on 11/23/2016 at 4:47 PM  Between 7am to 7pm - Pager - [phone]. After 7pm go to [email or web address] - password [email or web address]  [facility] - Office  [phone]

## 2016-11-23 NOTE — ED Provider Notes (Signed)
[facility] DEPT Provider Note   CSN: [id number] Arrival date & time: 11/23/16  1046     History   Chief Complaint Chief Complaint  Patient presents with  . possible DVT  . Shortness of Breath    HPI Edward Day is a 31 y.o. male.  Patient with history of right-sided anterior cruciate ligament reconstruction in early July 2018, history of polycythemia vera, previous smoker -- presents with complaint of right leg swelling and shortness of breath. Patient is noted swelling and pain in the right leg over the past 1-2 weeks with onset of shortness of breath 5 days ago. Patient had more severe shortness of breath and chest pain at onset that became more of a mild shortness of breath, worse with exertion. Patient does note feeling subjectively short of breath at rest. Patient was seen by physical therapy today and was sent to the emergency department with concern for DVT. No lightheadedness or syncope. No history of blood clots in patient or family. Patient previously had phlebotomies, but none recently. The onset of this condition was acute. The course is constant. lleviating factors: none.        Past Medical History:  Diagnosis Date  . GERD (gastroesophageal reflux disease)   . H. pylori infection   . Hypertension   . Low testosterone   . Polycythemia     Patient Active Problem List   Diagnosis Date Noted  . GASTROENTERITIS 02/17/2008  . HYPERTENSION, BENIGN ESSENTIAL 09/25/2006  . WEIGHT GAIN 09/25/2006  . COMMON MIGRAINE 09/24/2006    Past Surgical History:  Procedure Laterality Date  . APPENDECTOMY  2003  . ELBOW SURGERY  2014  . FINGER SURGERY Right 2003  . KNEE SURGERY    . SHOULDER SURGERY  2009, 2013, 2013, 2014  . TONSILLECTOMY         Home Medications    Prior to Admission medications   Medication Sig Start Date End Date Taking? Authorizing Provider  clomiPHENE (CLOMID) 50 MG tablet Take 1 tablet (50 mg total) by mouth every other day. 09/06/16    Caryn Section, Linden Dolin, PA-C  fluticasone [facility]) 50 MCG/ACT nasal spray SPRAY 2 SPRAYS IN [facility] NOSTRIL ONCE A DAY 06/06/15   Caryn Section Linden Dolin, PA-C  losartan (COZAAR) 50 MG tablet Take 1 tablet (50 mg total) by mouth daily. 09/06/16   Fisher, Linden Dolin, PA-C  meloxicam (MOBIC) 15 MG tablet Take 1 tablet (15 mg total) by mouth daily. 09/09/16 09/09/17  Loney Hering, MD  mupirocin ointment (BACTROBAN) 2 % Apply 1 application topically 2 (two) times daily. Patient not taking: Reported on 08/23/2016 07/19/16   Versie Starks, PA-C  pantoprazole (PROTONIX) 40 MG tablet Take 1 tablet (40 mg total) by mouth daily. 09/06/16   Fisher, Linden Dolin, PA-C  pantoprazole (PROTONIX) 40 MG tablet TAKE 1 TABLET (40 MG TOTAL) BY MOUTH DAILY. 10/05/16   Fisher, Linden Dolin, PA-C  traMADol (ULTRAM) 50 MG tablet Take 1 tablet (50 mg total) by mouth every 6 (six) hours as needed. 09/09/16   Loney Hering, MD  traZODone (DESYREL) 150 MG tablet TAKE 1 TABLET (150 MG TOTAL) BY MOUTH AT BEDTIME AS NEEDED FOR SLEEP. 08/29/16   Caryn Section Linden Dolin, PA-C    Family History Family History  Problem Relation Age of Onset  . Hypertension Mother   . Polycythemia Mother   . Hyperlipidemia Mother   . Diabetes Father   . Hypertension Father   . Cancer Maternal Grandmother  breast cancer  . Dementia Maternal Grandmother   . Diabetes Maternal Grandmother     Social History Social History  Substance Use Topics  . Smoking status: Current Some Day Smoker    Packs/day: 0.50    Years: 15.00    Types: Cigarettes  . Smokeless tobacco: Current User    Types: Snuff     Comment: has cut back to one cig every 6 days 09/06/16  . Alcohol use 0.0 oz/week     Comment: 4/week     Allergies   Banana   Review of Systems Review of Systems  Constitutional: Negative for fever.  HENT: Negative for rhinorrhea and sore throat.   Eyes: Negative for redness.  Respiratory: Positive for shortness of breath. Negative for cough.   Cardiovascular:  Positive for chest pain and leg swelling.  Gastrointestinal: Negative for abdominal pain, diarrhea, nausea and vomiting.  Genitourinary: Negative for dysuria.  Musculoskeletal: Negative for myalgias.  Skin: Negative for rash.  Neurological: Negative for headaches.     Physical Exam Updated Vital Signs BP (!) 162/104   Pulse (!) 102   Temp 98.2 F (36.8 C) (Oral)   Resp 20   SpO2 100%   Physical Exam  Constitutional: He appears well-developed and well-nourished.  HENT:  Head: Normocephalic and atraumatic.  Mouth/Throat: Oropharynx is clear and moist.  Eyes: Conjunctivae are normal. Right eye exhibits no discharge. Left eye exhibits no discharge.  Neck: Normal range of motion. Neck supple.  Cardiovascular: Regular rhythm and normal heart sounds.  Tachycardia present.   Pulmonary/Chest: Effort normal and breath sounds normal.  Abdominal: Soft. There is no tenderness.  Musculoskeletal: He exhibits edema and tenderness.  Grossly edematous right lower extremity with pitting edema noted. No cellulitis. Generalized tenderness.  Neurological: He is alert.  Skin: Skin is warm and dry.  Psychiatric: He has a normal mood and affect.  Nursing note and vitals reviewed.    ED Treatments / Results  Labs (all labs ordered are listed, but only abnormal results are displayed) Labs Reviewed  CBC WITH DIFFERENTIAL/PLATELET  PROTIME-INR  BASIC METABOLIC PANEL    EKG  EKG Interpretation  Date/Time:  Friday November 23 2016 11:10:35 EDT Ventricular Rate:  97 PR Interval:    QRS Duration: 85 QT Interval:  325 QTC Calculation: 413 R Axis:   -8 Text Interpretation:  Sinus rhythm Probable anteroseptal infarct, old Baseline wander in lead(s) V3 V4 V5 V6 similar to 2015 Confirmed by Sherwood Gambler [phone]) on 11/23/2016 12:11:00 PM       Radiology Ct Angio Chest Pe W And/or Wo Contrast  Result Date: 11/23/2016 CLINICAL DATA:  Calf edema and shortness of breath.  Positive DVT. EXAM: CT  ANGIOGRAPHY CHEST WITH CONTRAST TECHNIQUE: Multidetector CT imaging of the chest was performed using the standard protocol during bolus administration of intravenous contrast. Multiplanar CT image reconstructions and MIPs were obtained to evaluate the vascular anatomy. CONTRAST:  100 mL Isovue 370 COMPARISON:  Chest CT 11/09/2013 FINDINGS: Cardiovascular: D studies degraded blood poor contrast bolus timing and beam attenuation secondary to patient body habitus.There is a large filling defect within the distal right pulmonary artery that extends into the proximal right upper lobar and right middle lobar arteries. The clot extends more distally in the right lower lobe, to the peripheral segmental level. No central or lobar left-sided pulmonary embolus. Visualization of the more distal left-sided pulmonary arteries is poor. The main pulmonary artery is within normal limits for size. There is no CT evidence of  acute right heart strain. The RV to LV ratio is 0.7. The visualized aorta is normal. There is a normal 3-vessel arch branching pattern. Heart size is normal, without pericardial effusion. Mediastinum/Nodes: No mediastinal, hilar or axillary lymphadenopathy. The visualized thyroid and thoracic esophageal course are unremarkable. Lungs/Pleura: No pulmonary nodules or masses. No pleural effusion or pneumothorax. No focal airspace consolidation. No focal pleural abnormality. Upper Abdomen: Contrast bolus timing is not optimized for evaluation of the abdominal organs. Within this limitation, the visualized organs of the upper abdomen are normal. Musculoskeletal: No chest wall abnormality. No acute or significant osseous findings. Review of the MIP images confirms the above findings. IMPRESSION: 1. Acute pulmonary embolus within the distal right pulmonary artery extending into the right upper and middle lobar branches and into the right lower lobe the distal segmental branches. 2. Poor visualization of the left-sided  distal pulmonary arteries due to poor contrast bolus and beam attenuation by patient body habitus. No central left-sided pulmonary embolus. 3. No evidence of right heart strain. Critical Value/emergent results were called by telephone at the time of interpretation on 11/23/2016 at 4:06 pm to Dr. Carlisle Cater , who verbally acknowledged these results. Electronically Signed   By: Ulyses Jarred M.D.   On: 11/23/2016 16:07    Procedures Procedures (including critical care time)  Medications Ordered in ED Medications  iopamidol (ISOVUE-370) 76 % injection (not administered)  sodium chloride 0.9 % bolus 500 mL (500 mLs Intravenous New Bag/Given 11/23/16 1604)  enoxaparin (LOVENOX) injection 140 mg (not administered)  iopamidol (ISOVUE-370) 76 % injection 100 mL (100 mLs Intravenous Contrast Given 11/23/16 1528)     Initial Impression / Assessment and Plan / ED Course  I have reviewed the triage vital signs and the nursing notes.  Pertinent labs & imaging results that were available during my care of the patient were reviewed by me and considered in my medical decision making (see chart for details).     Patient seen and examined. Clinical concern for DVT/PE but patient stable. Work-up initiated. Discussed with Dr. Regenia Skeeter. Pending CT chest, LE doppler. No distress, mild tachycardia.   Vital signs reviewed and are as follows: BP (!) 162/104   Pulse (!) 102   Temp 98.2 F (36.8 C) (Oral)   Resp 20   SpO2 100%   4:20 PM CT reviewed by myself, spoke with radiologist. Lovenox ordered.   Pt and family updated.   Spoke with Dr. Waldron Labs who will admit.   Final Clinical Impressions(s) / ED Diagnoses   Final diagnoses:  Other acute pulmonary embolism without acute cor pulmonale ([place])   Admit.   New Prescriptions New Prescriptions   No medications on file     Carlisle Cater, Hershal Coria 11/23/16 1622    Sherwood Gambler, MD 12/01/16 [phone]

## 2016-11-24 ENCOUNTER — Observation Stay (HOSPITAL_COMMUNITY): Payer: Managed Care, Other (non HMO)

## 2016-11-24 DIAGNOSIS — I824Z1 Acute embolism and thrombosis of unspecified deep veins of right distal lower extremity: Secondary | ICD-10-CM

## 2016-11-24 DIAGNOSIS — I10 Essential (primary) hypertension: Secondary | ICD-10-CM | POA: Diagnosis not present

## 2016-11-24 DIAGNOSIS — I2699 Other pulmonary embolism without acute cor pulmonale: Secondary | ICD-10-CM | POA: Diagnosis not present

## 2016-11-24 LAB — HIV ANTIBODY (ROUTINE TESTING W REFLEX): HIV SCREEN 4TH GENERATION: NONREACTIVE

## 2016-11-24 LAB — BASIC METABOLIC PANEL
Anion gap: 7 (ref 5–15)
BUN: 14 mg/dL (ref 6–20)
CHLORIDE: 106 mmol/L (ref 101–111)
CO2: 26 mmol/L (ref 22–32)
CREATININE: 0.85 mg/dL (ref 0.61–1.24)
Calcium: 8.2 mg/dL — ABNORMAL LOW (ref 8.9–10.3)
Glucose, Bld: 112 mg/dL — ABNORMAL HIGH (ref 65–99)
POTASSIUM: 3.5 mmol/L (ref 3.5–5.1)
SODIUM: 139 mmol/L (ref 135–145)

## 2016-11-24 LAB — CBC
HEMATOCRIT: 39.8 % (ref 39.0–52.0)
HEMOGLOBIN: 13.6 g/dL (ref 13.0–17.0)
MCH: 30.6 pg (ref 26.0–34.0)
MCHC: 34.2 g/dL (ref 30.0–36.0)
MCV: 89.6 fL (ref 78.0–100.0)
Platelets: 178 10*3/uL (ref 150–400)
RBC: 4.44 MIL/uL (ref 4.22–5.81)
RDW: 13.1 % (ref 11.5–15.5)
WBC: 7.6 10*3/uL (ref 4.0–10.5)

## 2016-11-24 LAB — ECHOCARDIOGRAM COMPLETE
HEIGHTINCHES: 73 in
Weight: 5040 oz

## 2016-11-24 MED ORDER — RIVAROXABAN (XARELTO) VTE STARTER PACK (15 & 20 MG): ORAL_TABLET | ORAL | 0 refills | Status: DC

## 2016-11-24 MED ORDER — HYDROCODONE-ACETAMINOPHEN 5-325 MG PO TABS: 1.0000 | ORAL_TABLET | Freq: Four times a day (QID) | ORAL | 0 refills | Status: DC | PRN

## 2016-11-24 MED ORDER — PERFLUTREN LIPID MICROSPHERE
1.0000 mL | INTRAVENOUS | Status: AC | PRN
  Administered 2016-11-24: 2 mL via INTRAVENOUS
  Filled 2016-11-24: qty 10

## 2016-11-24 MED ORDER — RIVAROXABAN 20 MG PO TABS: 20.0000 mg | ORAL_TABLET | Freq: Two times a day (BID) | ORAL | Status: DC

## 2016-11-24 MED ORDER — RIVAROXABAN 15 MG PO TABS
15.0000 mg | ORAL_TABLET | Freq: Two times a day (BID) | ORAL | Status: DC
  Administered 2016-11-24: 15 mg via ORAL
  Filled 2016-11-24: qty 1

## 2016-11-24 MED ORDER — SALINE SPRAY 0.65 % NA SOLN
1.0000 | NASAL | Status: DC | PRN
  Administered 2016-11-24 (×2): 1 via NASAL
  Filled 2016-11-24: qty 44

## 2016-11-24 NOTE — Progress Notes (Signed)
[place] for  rivaroxaban Indication: pulmonary embolus  Allergies  Allergen Reactions  . Banana Swelling    Patient Measurements: Height: 6\' 1"  (185.4 cm) Weight: (!) 315 lb (142.9 kg) IBW/kg (Calculated) : 79.9 Heparin Dosing Weight:   Vital Signs: Temp: 98.1 F (36.7 C) (08/11 0529) Temp Source: Axillary (08/11 0529) BP: 133/94 (08/11 0529) Pulse Rate: 72 (08/11 0529)  Labs:  Recent Labs  11/23/16 1240 11/23/16 1907 11/24/16 0502  HGB 15.4  --  13.6  HCT 44.1  --  39.8  PLT 170  --  178  LABPROT 12.9  --   --   INR 0.97  --   --   CREATININE 0.88  --  0.85  TROPONINI  --  <0.03  --     Estimated Creatinine Clearance: 185.5 mL/min (by C-G formula based on SCr of 0.85 mg/dL).   Medical History: Past Medical History:  Diagnosis Date  . GERD (gastroesophageal reflux disease)   . H. pylori infection   . Hypertension   . Low testosterone   . Polycythemia     Assessment: 60 YOM with presents with DVT of RLE and PE.  ED note states he had recent knee surgery.  Today, 11/24/2016  Renal: SCR WNL  CBC: Hgb and Pltc WNL  No bleeding noted on the chart  Last dose of enoxaparin charted at 1659 on 8/10  Goal of Therapy:  Anti-Xa level 0.6-1 units/ml 4hrs after LMWH dose given Monitor platelets by anticoagulation protocol: Yes   Plan:   Rivaroxaban 15 mg PO BID with meals  x 21 days, then 20 mg PO daily   Monitor renal function, signs and symptoms of bleeding    Royetta Asal, PharmD, BCPS Pager [phone] 11/24/2016 7:25 AM

## 2016-11-24 NOTE — Discharge Instructions (Signed)

## 2016-11-24 NOTE — Discharge Summary (Signed)
Edward Day, is a 31 y.o. male  DOB 09-30-1985  MRN [id number].  Admission date:  11/23/2016  Admitting Physician  Albertine Patricia, MD  Discharge Date:  11/24/2016   Primary MD  Versie Starks, PA-C  Recommendations for primary care physician for things to follow:  - Please check CBC, BMP during next visit as patient was started on Xarelto for acute DVT and PE.   Admission Diagnosis  Other acute pulmonary embolism without acute cor pulmonale (HCC) [I26.99]   Discharge Diagnosis  Other acute pulmonary embolism without acute cor pulmonale (HCC) [I26.99]   Active Problems:   HYPERTENSION, BENIGN ESSENTIAL   WEIGHT GAIN   Pulmonary embolism (HCC)      Past Medical History:  Diagnosis Date  . GERD (gastroesophageal reflux disease)   . H. pylori infection   . Hypertension   . Low testosterone   . Polycythemia     Past Surgical History:  Procedure Laterality Date  . APPENDECTOMY  2003  . ELBOW SURGERY  2014  . FINGER SURGERY Right 2003  . KNEE SURGERY    . SHOULDER SURGERY  2009, 2013, 2013, 2014  . TONSILLECTOMY         History of present illness and  Hospital Course:     Kindly see H&P for history of present illness and admission details, please review complete Labs, Consult reports and Test reports for all details in brief  HPI  from the history and physical done on the day of admission 11/23/2016 Haziel Molner  is a 31 y.o. 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, required phlebotomy 2 years ago, resolved after stopping testosterone, hypertension, hyperlipidemia, obesity, quit smoking 4 months ago, patient status post recent right knee anterior cruciate ligament reconstruction 10/18/2016, presents with complaints of right lower extremity swelling, tenderness, shortness of breath, chest pain, reports he had been having dyspnea for last 5 days,  and pleuritic chest pain, and over the last 48 hours his leg started to become more swollen and tender which prompted him to come to ED, NAD venous Doppler significant for DVT, and CTA chest significant for right lung PE, patient was started on Lovenox, he was not hypoxic, not tachycardic, no troponins done in ED, PA with no evidence of right heart strain, patient was started on Lovenox , patient reports his been using Clomid for few month for energy boost, denies any family history of DVT, reports mother history is significant for polycythemia.    Hospital Course   Pulmonary embolism, right lower extremity DVT - There is provoked DVT, given recent knee surgery, as well patient on Clomid which is thrombogenic as well, CT with no evidence of right heart strain, 2-D echo has been obtained, with no evidence of right heart strain as well, troponins on admissions were negative, no hypoxia, tachycardia, today no chest pain at day of discharge, patient was started on Lovenox, he was transitioned to [place] charge, I have consulted him about stopping Clomid, and all other forms  of anabolic/hormonal supplements. - Need to be on anticoagulation for at least 6 month.  Obesity  - Was counseled about weight loss   Hypertension - Continue with home medication  History of polycythemia - This is most likely secondary to polycystic ischemia secondary to testosterone use, and history of smoking in the past, been within normal range   Discharge Condition:  Stable   Follow UP  Follow-up Information    Versie Starks, PA-C Follow up in 1 week(s).   Specialty:  Physician Assistant Contact information: [place]  [phone]             Discharge Instructions  and  Discharge Medications     Discharge Instructions    Discharge instructions    Complete by:  As directed    Follow with Primary MD Caryn Section Linden Dolin, PA-C in 7 days   Get CBC, CMP, checked  by  Primary MD next visit.    Activity: As tolerated with Full fall precautions use walker/cane & assistance as needed   Disposition Home    Diet: Heart Healthy    On your next visit with your primary care physician please Get Medicines reviewed and adjusted.   Please request your Prim.MD to go over all Hospital Tests and Procedure/Radiological results at the follow up, please get all Hospital records sent to your Prim MD by signing hospital release before you go home.   If you experience worsening of your admission symptoms, develop shortness of breath, life threatening emergency, suicidal or homicidal thoughts you must seek medical attention immediately by calling 911 or calling your MD immediately  if symptoms less severe.  You Must read complete instructions/literature along with all the possible adverse reactions/side effects for all the Medicines you take and that have been prescribed to you. Take any new Medicines after you have completely understood and accpet all the possible adverse reactions/side effects.   Do not drive, operating heavy machinery, perform activities at heights, swimming or participation in water activities or provide baby sitting services if your were admitted for syncope or siezures until you have seen by Primary MD or a Neurologist and advised to do so again.  Do not drive when taking Pain medications.    Do not take more than prescribed Pain, Sleep and Anxiety Medications  Special Instructions: If you have smoked or chewed Tobacco  in the last 2 yrs please stop smoking, stop any regular Alcohol  and or any Recreational drug use.  Wear Seat belts while driving.   Please note  You were cared for by a hospitalist during your hospital stay. If you have any questions about your discharge medications or the care you received while you were in the hospital after you are discharged, you can call the unit and asked to speak with the hospitalist on call if the  hospitalist that took care of you is not available. Once you are discharged, your primary care physician will handle any further medical issues. Please note that NO REFILLS for any discharge medications will be authorized once you are discharged, as it is imperative that you return to your primary care physician (or establish a relationship with a primary care physician if you do not have one) for your aftercare needs so that they can reassess your need for medications and monitor your lab values.   Increase activity slowly    Complete by:  As directed      Allergies as of 11/24/2016  Reactions   Banana Swelling      Medication List    STOP taking these medications   clomiPHENE 50 MG tablet Commonly known as:  CLOMID   diclofenac sodium 1 % Gel Commonly known as:  VOLTAREN   fluticasone 50 MCG/ACT nasal spray Commonly known as:  FLONASE   meloxicam 15 MG tablet Commonly known as:  MOBIC   mupirocin ointment 2 % Commonly known as:  BACTROBAN   traMADol 50 MG tablet Commonly known as:  ULTRAM     TAKE these medications   HYDROcodone-acetaminophen 5-325 MG tablet Commonly known as:  NORCO/VICODIN Take 1 tablet by mouth every 6 (six) hours as needed for moderate pain or severe pain.   losartan 50 MG tablet Commonly known as:  COZAAR Take 1 tablet (50 mg total) by mouth daily.   pantoprazole 40 MG tablet Commonly known as:  PROTONIX Take 1 tablet (40 mg total) by mouth daily. What changed:  Another medication with the same name was removed. Continue taking this medication, and follow the directions you see here.   Rivaroxaban 15 & 20 MG Tbpk Commonly known as:  XARELTO STARTER PACK Take as directed on package: Start with one 15mg  tablet by mouth twice a day with food. On Day 22, switch to one 20mg  tablet once a day with food.   traZODone 150 MG tablet Commonly known as:  DESYREL TAKE 1 TABLET (150 MG TOTAL) BY MOUTH AT BEDTIME AS NEEDED FOR SLEEP.         Diet  and Activity recommendation: See Discharge Instructions above   Consults obtained -  None   Major procedures and Radiology Reports - PLEASE review detailed and final reports for all details, in brief -      Ct Angio Chest Pe W And/or Wo Contrast  Result Date: 11/23/2016 CLINICAL DATA:  Calf edema and shortness of breath.  Positive DVT. EXAM: CT ANGIOGRAPHY CHEST WITH CONTRAST TECHNIQUE: Multidetector CT imaging of the chest was performed using the standard protocol during bolus administration of intravenous contrast. Multiplanar CT image reconstructions and MIPs were obtained to evaluate the vascular anatomy. CONTRAST:  100 mL Isovue 370 COMPARISON:  Chest CT 11/09/2013 FINDINGS: Cardiovascular: D studies degraded blood poor contrast bolus timing and beam attenuation secondary to patient body habitus.There is a large filling defect within the distal right pulmonary artery that extends into the proximal right upper lobar and right middle lobar arteries. The clot extends more distally in the right lower lobe, to the peripheral segmental level. No central or lobar left-sided pulmonary embolus. Visualization of the more distal left-sided pulmonary arteries is poor. The main pulmonary artery is within normal limits for size. There is no CT evidence of acute right heart strain. The RV to LV ratio is 0.7. The visualized aorta is normal. There is a normal 3-vessel arch branching pattern. Heart size is normal, without pericardial effusion. Mediastinum/Nodes: No mediastinal, hilar or axillary lymphadenopathy. The visualized thyroid and thoracic esophageal course are unremarkable. Lungs/Pleura: No pulmonary nodules or masses. No pleural effusion or pneumothorax. No focal airspace consolidation. No focal pleural abnormality. Upper Abdomen: Contrast bolus timing is not optimized for evaluation of the abdominal organs. Within this limitation, the visualized organs of the upper abdomen are normal. Musculoskeletal: No  chest wall abnormality. No acute or significant osseous findings. Review of the MIP images confirms the above findings. IMPRESSION: 1. Acute pulmonary embolus within the distal right pulmonary artery extending into the right upper and middle lobar branches and  into the right lower lobe the distal segmental branches. 2. Poor visualization of the left-sided distal pulmonary arteries due to poor contrast bolus and beam attenuation by patient body habitus. No central left-sided pulmonary embolus. 3. No evidence of right heart strain. Critical Value/emergent results were called by telephone at the time of interpretation on 11/23/2016 at 4:06 pm to Dr. Carlisle Cater , who verbally acknowledged these results. Electronically Signed   By: Ulyses Jarred M.D.   On: 11/23/2016 16:07    Micro Results    No results found for this or any previous visit (from the past 240 hour(s)).     Today   Subjective:   Bronc Brosseau today has no headache,no chest OR abdominal pain complaints of right calf pain, feels much better wants to go home today.   Objective:   Blood pressure (!) 133/94, pulse 72, temperature 98.1 F (36.7 C), temperature source Axillary, resp. rate 16, height 6\' 1"  (1.854 m), weight (!) 142.9 kg (315 lb), SpO2 97 %.   Intake/Output Summary (Last 24 hours) at 11/24/16 1445 Last data filed at 11/24/16 1156  Gross per 24 hour  Intake          1273.75 ml  Output             1525 ml  Net          -251.25 ml    Exam Awake Alert, Oriented x 3 Symmetrical Chest wall movement, Good air movement bilaterally, CTAB RRR,No Gallops,Rubs or new Murmurs, No Parasternal Heave +ve B.Sounds, Abd Soft, Non tender, No rebound -guarding or rigidity. No Cyanosis, Clubbing , No new Rash or bruise, right mildly swollen, but nontender, no erythema  Data Review   CBC w Diff:  Lab Results  Component Value Date   WBC 7.6 11/24/2016   HGB 13.6 11/24/2016   HGB 16.0 09/06/2016   HCT 39.8 11/24/2016   HCT  46.5 09/06/2016   PLT 178 11/24/2016   PLT 240 09/06/2016   LYMPHOPCT 28 11/23/2016   LYMPHOPCT 24.6 11/07/2013   MONOPCT 10 11/23/2016   MONOPCT 6.8 11/07/2013   EOSPCT 1 11/23/2016   EOSPCT 0.9 11/07/2013   BASOPCT 0 11/23/2016   BASOPCT 0.7 11/07/2013    CMP:  Lab Results  Component Value Date   NA 139 11/24/2016   NA 148 (H) 09/06/2016   NA 139 11/07/2013   K 3.5 11/24/2016   K 3.6 11/07/2013   CL 106 11/24/2016   CL 103 11/07/2013   CO2 26 11/24/2016   CO2 30 11/07/2013   BUN 14 11/24/2016   BUN 15 09/06/2016   BUN 8 11/07/2013   CREATININE 0.85 11/24/2016   CREATININE 0.94 11/07/2013   PROT 7.3 09/06/2016   PROT 8.3 (H) 11/06/2013   ALBUMIN 4.6 09/06/2016   ALBUMIN 3.6 11/06/2013   BILITOT 0.4 09/06/2016   BILITOT 0.3 11/06/2013   ALKPHOS 68 09/06/2016   ALKPHOS 83 11/06/2013   AST 36 09/06/2016   AST 42 (H) 11/06/2013   ALT 43 09/06/2016   ALT 57 11/06/2013  .   Total Time in preparing paper work, data evaluation and todays exam - 35 minutes  ELGERGAWY, DAWOOD M.D on 11/24/2016 at 2:45 PM  [facility]   Office  [phone]

## 2016-11-24 NOTE — Progress Notes (Addendum)
  Echocardiogram 2D Echocardiogram with definity has been performed. Patient unable to lay in LLD position due to recent knee surgery.   Edward Day L Androw 11/24/2016, 1:38 PM

## 2016-11-25 NOTE — Progress Notes (Signed)
Discharge Planning: Pharmacy provided pt with Xarelto 30 day free trial card. Mailed pt a Xarelto copay card. Jonnie Finner RN CCM Case Mgmt phone [phone]

## 2016-11-26 ENCOUNTER — Encounter: Payer: Self-pay | Admitting: Physician Assistant

## 2016-11-26 ENCOUNTER — Ambulatory Visit: Payer: Self-pay | Admitting: Physician Assistant

## 2016-11-26 VITALS — BP 150/80 | HR 94 | Temp 97.4°F

## 2016-11-26 DIAGNOSIS — T81718D Complication of other artery following a procedure, not elsewhere classified, subsequent encounter: Secondary | ICD-10-CM

## 2016-11-26 DIAGNOSIS — I2699 Other pulmonary embolism without acute cor pulmonale: Secondary | ICD-10-CM

## 2016-11-26 DIAGNOSIS — I82401 Acute embolism and thrombosis of unspecified deep veins of right lower extremity: Secondary | ICD-10-CM

## 2016-11-26 NOTE — Patient Instructions (Addendum)
Deep Vein Thrombosis A deep vein thrombosis (DVT) is a blood clot (thrombus) that usually occurs in a deep, larger vein of the lower leg or the pelvis, or in an upper extremity such as the arm. These are dangerous and can lead to serious and even life-threatening complications if the clot travels to the lungs. A DVT can damage the valves in your leg veins so that instead of flowing upward, the blood pools in the lower leg. This is called post-thrombotic syndrome, and it can result in pain, swelling, discoloration, and sores on the leg. What are the causes? A DVT is caused by the formation of a blood clot in your leg, pelvis, or arm. Usually, several things contribute to the formation of blood clots. A clot may develop when:  Your blood flow slows down.  Your vein becomes damaged in some way.  You have a condition that makes your blood clot more easily.  What increases the risk? A DVT is more likely to develop in:  People who are older, especially over 60 years of age.  People who are overweight (obese).  People who sit or lie still for a long time, such as during long-distance travel (over 4 hours), bed rest, hospitalization, or during recovery from certain medical conditions like a stroke.  People who do not engage in much physical activity (sedentary lifestyle).  People who have chronic breathing disorders.  People who have a personal or family history of blood clots or blood clotting disease.  People who have peripheral vascular disease (PVD), diabetes, or some types of cancer.  People who have heart disease, especially if the person had a recent heart attack or has congestive heart failure.  People who have neurological diseases that affect the legs (leg paresis).  People who have had a traumatic injury, such as breaking a hip or leg.  People who have recently had major or lengthy surgery, especially on the hip, knee, or abdomen.  People who have had a central line placed  inside a large vein.  People who take medicines that contain the hormone estrogen. These include birth control pills and hormone replacement therapy.  Pregnancy or during childbirth or the postpartum period.  Long plane flights (over 8 hours).  What are the signs or symptoms?  Symptoms of a DVT can include:  Swelling of your leg or arm, especially if one side is much worse.  Warmth and redness of your leg or arm, especially if one side is much worse.  Pain in your arm or leg. If the clot is in your leg, symptoms may be more noticeable or worse when you stand or walk.  A feeling of pins and needles, if the clot is in the arm.  The symptoms of a DVT that has traveled to the lungs (pulmonary embolism, PE) usually start suddenly and include:  Shortness of breath while active or at rest.  Coughing or coughing up blood or blood-tinged mucus.  Chest pain that is often worse with deep breaths.  Rapid or irregular heartbeat.  Feeling light-headed or dizzy.  Fainting.  Feeling anxious.  Sweating.  There may also be pain and swelling in a leg if that is where the blood clot started. These symptoms may represent a serious problem that is an emergency. Do not wait to see if the symptoms will go away. Get medical help right away. Call your local emergency services (911 in the [place] Do not drive yourself to the hospital. How is this diagnosed? Your health   care provider will take a medical history and perform a physical exam. You may also have other tests, including:  Blood tests to assess the clotting properties of your blood.  Imaging tests, such as CT, ultrasound, MRI, X-ray, and other tests to see if you have clots anywhere in your body.  How is this treated? After a DVT is identified, it can be treated. The type of treatment that you receive depends on many factors, such as the cause of your DVT, your risk for bleeding or developing more clots, and other medical conditions that  you have. Sometimes, a combination of treatments is necessary. Treatment options may be combined and include:  Monitoring the blood clot with ultrasound.  Taking medicines by mouth, such as newer blood thinners (anticoagulants), thrombolytics, or warfarin.  Taking anticoagulant medicine by injection or through an IV tube.  Wearing compression stockings or using different types ofdevices.  Surgery (rare) to remove the blood clot or to place a filter in your abdomen to stop the blood clot from traveling to your lungs.  Treatments for a DVT are often divided into immediate treatment and long-term treatment (up to 3 months after DVT). You can work with your health care provider to choose the treatment program that is best for you. Follow these instructions at home: If you are taking a newer oral anticoagulant:  Take the medicine every single day at the same time each day.  Understand what foods and drugs interact with this medicine.  Understand that there are no regular blood tests required when using this medicine.  Understand the side effects of this medicine, including excessive bruising or bleeding. Ask your health care provider or pharmacist about other possible side effects. If you are taking warfarin:  Understand how to take warfarin and know which foods can affect how warfarin works in your body.  Understand that it is dangerous to take too much or too little warfarin. Too much warfarin increases the risk of bleeding. Too little warfarin continues to allow the risk for blood clots.  Follow your PT and INR blood testing schedule. The PT and INR results allow your health care provider to adjust your dose of warfarin. It is very important that you have your PT and INR tested as often as told by your health care provider.  Avoid major changes in your diet, or tell your health care provider before you change your diet. Arrange a visit with a registered dietitian to answer your  questions. Many foods, especially foods that are high in vitamin K, can interfere with warfarin and affect the PT and INR results. Eat a consistent amount of foods that are high in vitamin K, such as: ? Spinach, kale, broccoli, cabbage, collard greens, turnip greens, Brussels sprouts, peas, cauliflower, seaweed, and parsley. ? Beef liver and pork liver. ? Green tea. ? Soybean oil.  Tell your health care provider about any and all medicines, vitamins, and supplements that you take, including aspirin and other over-the-counter anti-inflammatory medicines. Be especially cautious with aspirin and anti-inflammatory medicines. Do not take those before you ask your health care provider if it is safe to do so. This is important because many medicines can interfere with warfarin and affect the PT and INR results.  Do not start or stop taking any over-the-counter or prescription medicine unless your health care provider or pharmacist tells you to do so. If you take warfarin, you will also need to do these things:  Hold pressure over cuts for longer than   usual.  Tell your dentist and other health care providers that you are taking warfarin before you have any procedures in which bleeding may occur.  Avoid alcohol or drink very small amounts. Tell your health care provider if you change your alcohol intake.  Do not use tobacco products, including cigarettes, chewing tobacco, and e-cigarettes. If you need help quitting, ask your health care provider.  Avoid contact sports.  General instructions  Take over-the-counter and prescription medicines only as told by your health care provider. Anticoagulant medicines can have side effects, including easy bruising and difficulty stopping bleeding. If you are prescribed an anticoagulant, you will also need to do these things: ? Hold pressure over cuts for longer than usual. ? Tell your dentist and other health care providers that you are taking anticoagulants  before you have any procedures in which bleeding may occur. ? Avoid contact sports.  Wear a medical alert bracelet or carry a medical alert card that says you have had a PE.  Ask your health care provider how soon you can go back to your normal activities. Stay active to prevent new blood clots from forming.  Make sure to exercise while traveling or when you have been sitting or standing for a long period of time. It is very important to exercise. Exercise your legs by walking or by tightening and relaxing your leg muscles often. Take frequent walks.  Wear compression stockings as told by your health care provider to help prevent more blood clots from forming.  Do not use tobacco products, including cigarettes, chewing tobacco, and e-cigarettes. If you need help quitting, ask your health care provider.  Keep all follow-up appointments with your health care provider. This is important. How is this prevented? Take these actions to decrease your risk of developing another DVT:  Exercise regularly. For at least 30 minutes every day, engage in: ? Activity that involves moving your arms and legs. ? Activity that encourages good blood flow through your body by increasing your heart rate.  Exercise your arms and legs every hour during long-distance travel (over 4 hours). Drink plenty of water and avoid drinking alcohol while traveling.  Avoid sitting or lying in bed for long periods of time without moving your legs.  Maintain a weight that is appropriate for your height. Ask your health care provider what weight is healthy for you.  If you are a woman who is over 35 years of age, avoid unnecessary use of medicines that contain estrogen. These include birth control pills.  Do not smoke, especially if you take estrogen medicines. If you need help quitting, ask your health care provider.  If you are hospitalized, prevention measures may include:  Early walking after surgery, as soon as your  health care provider says that it is safe.  Receiving anticoagulants to prevent blood clots.If you cannot take anticoagulants, other options may be available, such as wearing compression stockings or using different types of devices.  Get help right away if:  You have new or increased pain, swelling, or redness in an arm or leg.  You have numbness or tingling in an arm or leg.  You have shortness of breath while active or at rest.  You have chest pain.  You have a rapid or irregular heartbeat.  You feel light-headed or dizzy.  You cough up blood.  You notice blood in your vomit, bowel movement, or urine. These symptoms may represent a serious problem that is an emergency. Do not wait to see   if the symptoms will go away. Get medical help right away. Call your local emergency services (911 in the [place] Do not drive yourself to the hospital. This information is not intended to replace advice given to you by your health care provider. Make sure you discuss any questions you have with your health care provider. Document Released: 04/02/2005 Document Revised: 09/08/2015 Document Reviewed: 07/28/2014 Elsevier Interactive Patient Education  2017 [employer]  Pulmonary Embolism A pulmonary embolism (PE) is a sudden blockage or decrease of blood flow in one lung or both lungs. Most blockages come from a blood clot that travels from the legs or the pelvis to the lungs. PE is a dangerous and potentially life-threatening condition if it is not treated right away. What are the causes? A pulmonary embolism occurs most commonly when a blood clot travels from one of your veins to your lungs. Rarely, PE is caused by air, fat, amniotic fluid, or part of a tumor traveling through your veins to your lungs. What increases the risk? A PE is more likely to develop in:  People who smoke.  People who areolder, especially over 62 years of age.  People who are overweight (obese).  People who sit or  lie still for a long time, such as during long-distance travel (over 4 hours), bed rest, hospitalization, or during recovery from certain medical conditions like a stroke.  People who do not engage in much physical activity (sedentary lifestyle).  People who have chronic breathing disorders.  People whohave a personal or family history of blood clots or blood clotting disease.  People whohave peripheral vascular disease (PVD), diabetes, or some types of cancer.  People who haveheart disease, especially if the person had a recent heart attack or has congestive heart failure.  People who have neurological diseases that affect the legs (leg paresis).  People who have had a traumatic injury, such as breaking a hip or leg.  People whohave recently had major or lengthy surgery, especially on the hip, knee, or abdomen.  People who have hada central line placed inside a large vein.  People who takemedicines that contain the hormone estrogen. These include birth control pills and hormone replacement therapy.  Pregnancy or during childbirth or the postpartum period.  What are the signs or symptoms? The symptoms of a PE usually start suddenly and include:  Shortness of breath while active or at rest.  Coughing or coughing up blood or blood-tinged mucus.  Chest pain that is often worse with deep breaths.  Rapid or irregular heartbeat.  Feeling light-headed or dizzy.  Fainting.  Feelinganxious.  Sweating.  There may also be pain and swelling in a leg if that is where the blood clot started. These symptoms may represent a serious problem that is an emergency. Do not wait to see if the symptoms will go away. Get medical help right away. Call your local emergency services (911 in the [place] Do not drive yourself to the hospital. How is this diagnosed? Your health care provider will take a medical history and perform a physical exam. You may also have other tests,  including:  Blood tests to assess the clotting properties of your blood, assess oxygen levels in your blood, and find blood clots.  Imaging tests, such as CT, ultrasound, MRI, X-ray, and other tests to see if you have clots anywhere in your body.  An electrocardiogram (ECG) to look for heart strain from blood clots in the lungs.  How is this treated? The main goals  of PE treatment are:  To stop a blood clot from growing larger.  To stop new blood clots from forming.  The type of treatment that you receive depends on many factors, such as the cause of your PE, your risk for bleeding or developing more clots, and other medical conditions that you have. Sometimes, a combination of treatments is necessary. This condition may be treated with:  Medicines, including newer oral blood thinners (anticoagulants), warfarin, low molecular weight heparins, thrombolytics, or heparins.  Wearing compression stockings or using different types of devices.  Surgery (rare) to remove the blood clot or to place a filter in your abdomen to stop the blood clot from traveling to your lungs.  Treatments for a PE are often divided into immediate treatment, long-term treatment (up to 3 months after PE), and extended treatment (more than 3 months after PE). Your treatment may continue for several months. This is called maintenance therapy, and it is used to prevent the forming of new blood clots. You can work with your health care provider to choose the treatment program that is best for you. What are anticoagulants? Anticoagulants are medicines that treat PEs. They can stop current blood clots from growing and stop new clots from forming. They cannot dissolve existing clots. Your body dissolves clots by itself over time. Anticoagulants are given by mouth, by injection, or through an IV tube. What are thrombolytics? Thrombolytics are clot-dissolving medicines that are used to dissolve a PE. They carry a high risk of  bleeding, so they tend to be used only in severe cases or if you have very low blood pressure. Follow these instructions at home: If you are taking a newer oral anticoagulant:  Take the medicine every single day at the same time each day.  Understand what foods and drugs interact with this medicine.  Understand that there are no regular blood tests required when using this medicine.  Understandthe side effects of this medicine, including excessive bruising or bleeding. Ask your health care provider or pharmacist about other possible side effects. If you are taking warfarin:  Understand how to take warfarin and know which foods can affect how warfarin works in Veterinary surgeon.  Understand that it is dangerous to taketoo much or too little warfarin. Too much warfarin increases the risk of bleeding. Too little warfarin continues to allow the risk for blood clots.  Follow your PT and INR blood testing schedule. The PT and INR results allow your health care provider to adjust your dose of warfarin. It is very important that you have your PT and INR tested as often as told by your health care provider.  Avoid major changes in your diet, or tell your health care provider before you change your diet. Arrange a visit with a registered dietitian to answer your questions. Many foods, especially foods that are high in vitamin K, can interfere with warfarin and affect the PT and INR results. Eat a consistent amount of foods that are high in vitamin K, such as: ? Spinach, kale, broccoli, cabbage, collard greens, turnip greens, Brussels sprouts, peas, cauliflower, seaweed, and parsley. ? Beef liver and pork liver. ? Green tea. ? Soybean oil.  Tell your health care provider about any and all medicines, vitamins, and supplements that you take, including aspirin and other over-the-counter anti-inflammatory medicines. Be especially cautious with aspirin and anti-inflammatory medicines. Do not take those before you  ask your health care provider if it is safe to do so. This is important because  many medicines can interfere with warfarin and affect the PT and INR results.  Do not start or stop taking any over-the-counter or prescription medicine unless your health care provider or pharmacist tells you to do so. If you take warfarin, you will also need to do these things:  Hold pressure over cuts for longer than usual.  Tell your dentist and other health care providers that you are taking warfarin before you have any procedures in which bleeding may occur.  Avoid alcohol or drink very small amounts. Tell your health care provider if you change your alcohol intake.  Do not use tobacco products, including cigarettes, chewing tobacco, and e-cigarettes. If you need help quitting, ask your health care provider.  Avoid contact sports.  General instructions  Take over-the-counter and prescription medicines only as told by your health care provider. Anticoagulant medicines can have side effects, including easy bruising and difficulty stopping bleeding. If you are prescribed an anticoagulant, you will also need to do these things: ? Hold pressure over cuts for longer than usual. ? Tell your dentist and other health care providers that you are taking anticoagulants before you have any procedures in which bleeding may occur. ? Avoid contact sports.  Wear a medical alert bracelet or carry a medical alert card that says you have had a PE.  Ask your health care provider how soon you can go back to your normal activities. Stay active to prevent new blood clots from forming.  Make sure to exercise while traveling or when you have been sitting or standing for a long period of time. It is very important to exercise. Exercise your legs by walking or by tightening and relaxing your leg muscles often. Take frequent walks.  Wear compression stockings as told by your health care provider to help prevent more blood clots from  forming.  Do not use tobacco products, including cigarettes, chewing tobacco, and e-cigarettes. If you need help quitting, ask your health care provider.  Keep all follow-up appointments with your health care provider. This is important. How is this prevented? Take these actions to decrease your risk of developing another PE:  Exercise regularly. For at least 30 minutes every day, engage in: ? Activity that involves moving your arms and legs. ? Activity that encourages good blood flow through your body by increasing your heart rate.  Exercise your arms and legs every hour during long-distance travel (over 4 hours). Drink plenty of water and avoid drinking alcohol while traveling.  Avoid sitting or lying in bed for long periods of time without moving your legs.  Maintain a weight that is appropriate for your height. Ask your health care provider what weight is healthy for you.  If you are a woman who is over 82 years of age, avoid unnecessary use of medicines that contain estrogen. These include birth control pills.  Do not smoke, especially if you take estrogen medicines. If you need help quitting, ask your health care provider.  If you are at very high risk for PE, wear compression stockings.  If you recently had a PE, have regularly scheduled ultrasound testing on your legs to check for new blood clots.  If you are hospitalized, prevention measures may include:  Early walking after surgery, as soon as your health care provider says that it is safe.  Receiving anticoagulants to prevent blood clots. If you cannot take anticoagulants, other options may be available, such as wearing compression stockings or using different types of devices.  Get help  right away if:  You have new or increased pain, swelling, or redness in an arm or leg.  You have numbness or tingling in an arm or leg.  You have shortness of breath while active or at rest.  You have chest pain.  You have a rapid  or irregular heartbeat.  You feel light-headed or dizzy.  You cough up blood.  You notice blood in your vomit, bowel movement, or urine.  You have a fever. These symptoms may represent a serious problem that is an emergency. Do not wait to see if the symptoms will go away. Get medical help right away. Call your local emergency services (911 in the [place] Do not drive yourself to the hospital. This information is not intended to replace advice given to you by your health care provider. Make sure you discuss any questions you have with your health care provider. Document Released: 03/30/2000 Document Revised: 09/08/2015 Document Reviewed: 07/28/2014 Elsevier Interactive Patient Education  2017 [employer].

## 2016-11-26 NOTE — Progress Notes (Signed)
S: pt recently dx with PE and dvt, had surgery on his knee and has been "laid up"; had sob and was seen at 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 will be covered under wc so he was told to come here, states the leg is still swollen and hurts, some sob, especially with exertion, doesn't feel worse than when he was originally dx a few days ago,   O: vitals w slightly elevated bp at 150/80, pulse ox wnl, pulse is a little elevated, nad, lungs c t a, cv rrr, r lower leg is in post surgical knee brace, some swelling and tenderness in r lower leg, some warmth closer to achilles area, r knee has swelling and tenderness at r medial aspect, decreased rom due to brace and recent surgery, nv intact  A: post surgical dvt, PE  P: recheck with workers comp, should do labs on Monday for kidney functions, will discuss care options with dr Rosanna Randy

## 2016-11-27 ENCOUNTER — Ambulatory Visit: Payer: Self-pay | Admitting: Physician Assistant

## 2016-11-27 ENCOUNTER — Other Ambulatory Visit: Payer: Self-pay | Admitting: Physician Assistant

## 2016-11-27 ENCOUNTER — Encounter: Payer: Self-pay | Admitting: Physician Assistant

## 2016-11-27 VITALS — BP 140/85 | HR 104

## 2016-11-27 DIAGNOSIS — I82401 Acute embolism and thrombosis of unspecified deep veins of right lower extremity: Secondary | ICD-10-CM

## 2016-11-27 NOTE — Progress Notes (Signed)
S: here for recheck of dvt/PE, states the calf is not as tender but the knee really hurts, not sure if that is from the surgery or the clot, sob is about the same, no cp, no fever/chills, is still sweating a lot  O: Vitals wnl, pt not as diaphoretic as yesterday, lungs c  Ta, cv rrr, r lower leg is not as warm as yesterday, not as tender as yesterday, r knee appears to be more swollen than yesterday, calf measures 17.5 inches, knee measures 21.5 inches, n/v intact  A: f/u dvt, PE  P: lab work for clotting disorders, call ortho about increased knee pain

## 2016-12-04 LAB — FACTOR V LEIDEN REFLEX TO R2

## 2016-12-04 LAB — BRAIN NATRIURETIC PEPTIDE: BNP: 2.9 pg/mL (ref 0.0–100.0)

## 2016-12-04 LAB — FACTOR 2 ASSAY: FACTOR II ACTIVITY: 104 % (ref 50–154)

## 2016-12-04 LAB — COMPREHENSIVE METABOLIC PANEL
A/G RATIO: 1.5 (ref 1.2–2.2)
ALT: 55 IU/L — AB (ref 0–44)
AST: 38 IU/L (ref 0–40)
Albumin: 4.5 g/dL (ref 3.5–5.5)
Alkaline Phosphatase: 74 IU/L (ref 39–117)
BILIRUBIN TOTAL: 0.3 mg/dL (ref 0.0–1.2)
BUN/Creatinine Ratio: 12 (ref 9–20)
BUN: 12 mg/dL (ref 6–20)
CALCIUM: 9.6 mg/dL (ref 8.7–10.2)
CHLORIDE: 103 mmol/L (ref 96–106)
CO2: 25 mmol/L (ref 20–29)
Creatinine, Ser: 0.98 mg/dL (ref 0.76–1.27)
GFR calc Af Amer: 117 mL/min/{1.73_m2} (ref >59)
GFR calc non Af Amer: 102 mL/min/{1.73_m2} (ref >59)
Globulin, Total: 3.1 g/dL (ref 1.5–4.5)
Glucose: 122 mg/dL — ABNORMAL HIGH (ref 65–99)
POTASSIUM: 4.1 mmol/L (ref 3.5–5.2)
Sodium: 143 mmol/L (ref 134–144)
Total Protein: 7.6 g/dL (ref 6.0–8.5)

## 2016-12-04 LAB — PROTEIN S PANEL
Protein S Activity: 159 % — ABNORMAL HIGH (ref 63–140)
Protein S Ag, Free: 110 % (ref 57–157)
Protein S Ag, Total: 101 % (ref 60–150)

## 2016-12-04 LAB — ANTITHROMBIN PANEL
AT III AG PPP IMM-ACNC: 87 % (ref 72–124)
AntiThromb III Func: 121 % (ref 75–135)

## 2016-12-04 LAB — PROTEIN C DEFICIENCY PROFILE
PROTEIN C ACTIVITY: 156 % (ref 73–180)
PROTEIN C ANTIGEN: 141 % (ref 60–150)

## 2016-12-13 ENCOUNTER — Encounter: Payer: Self-pay | Admitting: Oncology

## 2016-12-13 ENCOUNTER — Inpatient Hospital Stay: Payer: Worker's Compensation | Attending: Oncology | Admitting: Oncology

## 2016-12-13 VITALS — BP 139/100 | HR 91 | Temp 99.0°F | Ht 72.0 in | Wt 322.5 lb

## 2016-12-13 DIAGNOSIS — Z79899 Other long term (current) drug therapy: Secondary | ICD-10-CM | POA: Insufficient documentation

## 2016-12-13 DIAGNOSIS — I2699 Other pulmonary embolism without acute cor pulmonale: Secondary | ICD-10-CM | POA: Diagnosis not present

## 2016-12-13 DIAGNOSIS — I82401 Acute embolism and thrombosis of unspecified deep veins of right lower extremity: Secondary | ICD-10-CM | POA: Diagnosis present

## 2016-12-13 DIAGNOSIS — I10 Essential (primary) hypertension: Secondary | ICD-10-CM | POA: Insufficient documentation

## 2016-12-13 DIAGNOSIS — Z7901 Long term (current) use of anticoagulants: Secondary | ICD-10-CM | POA: Insufficient documentation

## 2016-12-13 DIAGNOSIS — K219 Gastro-esophageal reflux disease without esophagitis: Secondary | ICD-10-CM | POA: Diagnosis not present

## 2016-12-13 DIAGNOSIS — Z87891 Personal history of nicotine dependence: Secondary | ICD-10-CM | POA: Diagnosis not present

## 2016-12-13 DIAGNOSIS — Z6841 Body Mass Index (BMI) 40.0 and over, adult: Secondary | ICD-10-CM | POA: Diagnosis not present
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
## 2016-12-13 NOTE — Progress Notes (Signed)
Patient here today as a new patient for blood clot in right lung and right leg

## 2016-12-25 ENCOUNTER — Other Ambulatory Visit: Payer: Self-pay | Admitting: *Deleted

## 2016-12-25 MED ORDER — RIVAROXABAN 20 MG PO TABS: 20.0000 mg | ORAL_TABLET | Freq: Every day | ORAL | 1 refills | Status: DC

## 2016-12-25 NOTE — Telephone Encounter (Signed)
Patient was just seen a week and a half ago.  Okay to refill?

## 2016-12-25 NOTE — Telephone Encounter (Signed)
Asking for refill of Xarelto without having to come in for a visit

## 2017-02-19 ENCOUNTER — Other Ambulatory Visit: Payer: Self-pay | Admitting: Oncology

## 2017-03-06 ENCOUNTER — Encounter: Payer: Self-pay | Admitting: Physician Assistant

## 2017-03-06 ENCOUNTER — Ambulatory Visit: Payer: Self-pay | Admitting: Physician Assistant

## 2017-03-06 VITALS — BP 140/98

## 2017-03-06 DIAGNOSIS — I10 Essential (primary) hypertension: Secondary | ICD-10-CM

## 2017-03-06 DIAGNOSIS — M25511 Pain in right shoulder: Secondary | ICD-10-CM

## 2017-03-06 DIAGNOSIS — R7989 Other specified abnormal findings of blood chemistry: Secondary | ICD-10-CM

## 2017-03-06 MED ORDER — LOSARTAN POTASSIUM 100 MG PO TABS: 100.0000 mg | ORAL_TABLET | Freq: Every day | ORAL | 3 refills | Status: DC

## 2017-03-06 NOTE — Addendum Note (Signed)
Addended by: Rudene Anda T on: 03/06/2017 02:37 PM   Modules accepted: Orders

## 2017-03-06 NOTE — Progress Notes (Signed)
S: Patient is here today with concerns of elevated blood pressure, fatigue, and right shoulder pain, he has been taking losartan 50 mg a day consistently that his blood pressure is still elevated, denies chest pain or shortness of breath, he is still fatigued where his testosterone is low, his doctor that is monitoring his DVT/PE told him to stop the Clomid, he would like to go back on that when he is done with xeralto, and his right shoulder is sore, it hurts more along the ACJ, he is unable to take NSAIDs due to his medication, denies a new injury, denies numbness or tingling in the arm, states he has pain with movement  O: Vitals with blood pressure of 140/98, otherwise normal, lungs are clear to auscultation, heart sounds are normal, right shoulder is tender at ACJ, a little decreased range of motion with overhead reach, grips are equal bilaterally  A: Hypertension, right shoulder pain, low testosterone  P: Patient is to return for fasting labs with a male exact panel, testosterone level, vitamin D level, he has voltaren gel at home which she can use on the right shoulder, he is to take precautions that his small child not touch the medication, he will need to be reassessed by the new provider for the low testosterone, he has responded well to Clomid 50 mg every other day, I advised him not to take oral NSAIDs due to xeralto, exercises explained for his shoulder

## 2017-03-07 LAB — CBC WITH DIFFERENTIAL/PLATELET
BASOS ABS: 0 10*3/uL (ref 0.0–0.2)
Basos: 0 %
EOS (ABSOLUTE): 0.1 10*3/uL (ref 0.0–0.4)
EOS: 1 %
HEMATOCRIT: 48.3 % (ref 37.5–51.0)
Hemoglobin: 16.3 g/dL (ref 13.0–17.7)
Immature Grans (Abs): 0 10*3/uL (ref 0.0–0.1)
Immature Granulocytes: 0 %
LYMPHS ABS: 2.4 10*3/uL (ref 0.7–3.1)
Lymphs: 28 %
MCH: 30.8 pg (ref 26.6–33.0)
MCHC: 33.7 g/dL (ref 31.5–35.7)
MCV: 91 fL (ref 79–97)
Monocytes Absolute: 0.6 10*3/uL (ref 0.1–0.9)
Monocytes: 7 %
Neutrophils Absolute: 5.4 10*3/uL (ref 1.4–7.0)
Neutrophils: 64 %
Platelets: 266 10*3/uL (ref 150–379)
RBC: 5.29 x10E6/uL (ref 4.14–5.80)
RDW: 13.8 % (ref 12.3–15.4)
WBC: 8.6 10*3/uL (ref 3.4–10.8)

## 2017-03-07 LAB — BASIC METABOLIC PANEL
BUN/Creatinine Ratio: 15 (ref 9–20)
BUN: 12 mg/dL (ref 6–20)
CO2: 26 mmol/L (ref 20–29)
Calcium: 9.8 mg/dL (ref 8.7–10.2)
Chloride: 104 mmol/L (ref 96–106)
Creatinine, Ser: 0.8 mg/dL (ref 0.76–1.27)
GFR, EST AFRICAN AMERICAN: 137 mL/min/{1.73_m2} (ref >59)
GFR, EST NON AFRICAN AMERICAN: 118 mL/min/{1.73_m2} (ref >59)
Glucose: 97 mg/dL (ref 65–99)
POTASSIUM: 4.5 mmol/L (ref 3.5–5.2)
SODIUM: 144 mmol/L (ref 134–144)
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
## 2017-03-14 ENCOUNTER — Encounter: Payer: Self-pay | Admitting: Oncology

## 2017-03-14 ENCOUNTER — Inpatient Hospital Stay: Payer: Worker's Compensation | Attending: Oncology | Admitting: Oncology

## 2017-03-14 ENCOUNTER — Inpatient Hospital Stay: Payer: Worker's Compensation

## 2017-03-14 VITALS — BP 136/98 | HR 77 | Resp 16 | Wt 328.0 lb

## 2017-03-14 DIAGNOSIS — I2699 Other pulmonary embolism without acute cor pulmonale: Secondary | ICD-10-CM | POA: Insufficient documentation

## 2017-03-14 DIAGNOSIS — R51 Headache: Secondary | ICD-10-CM

## 2017-03-14 DIAGNOSIS — I10 Essential (primary) hypertension: Secondary | ICD-10-CM | POA: Diagnosis not present

## 2017-03-14 DIAGNOSIS — Z87891 Personal history of nicotine dependence: Secondary | ICD-10-CM | POA: Insufficient documentation

## 2017-03-14 DIAGNOSIS — Z86711 Personal history of pulmonary embolism: Secondary | ICD-10-CM | POA: Diagnosis not present

## 2017-03-14 DIAGNOSIS — Z79899 Other long term (current) drug therapy: Secondary | ICD-10-CM | POA: Diagnosis not present

## 2017-03-14 DIAGNOSIS — K219 Gastro-esophageal reflux disease without esophagitis: Secondary | ICD-10-CM | POA: Diagnosis not present

## 2017-03-14 DIAGNOSIS — Z6841 Body Mass Index (BMI) 40.0 and over, adult: Secondary | ICD-10-CM

## 2017-03-14 DIAGNOSIS — I82441 Acute embolism and thrombosis of right tibial vein: Secondary | ICD-10-CM | POA: Insufficient documentation

## 2017-03-14 DIAGNOSIS — G8929 Other chronic pain: Secondary | ICD-10-CM

## 2017-03-14 DIAGNOSIS — I82431 Acute embolism and thrombosis of right popliteal vein: Secondary | ICD-10-CM | POA: Diagnosis not present

## 2017-03-14 DIAGNOSIS — Z7901 Long term (current) use of anticoagulants: Secondary | ICD-10-CM | POA: Diagnosis not present

## 2017-03-14 DIAGNOSIS — I82401 Acute embolism and thrombosis of unspecified deep veins of right lower extremity: Secondary | ICD-10-CM

## 2017-03-14 LAB — COMPREHENSIVE METABOLIC PANEL
ALT: 50 U/L (ref 17–63)
ANION GAP: 8 (ref 5–15)
AST: 31 U/L (ref 15–41)
Albumin: 4.2 g/dL (ref 3.5–5.0)
Alkaline Phosphatase: 68 U/L (ref 38–126)
BUN: 15 mg/dL (ref 6–20)
CHLORIDE: 101 mmol/L (ref 101–111)
CO2: 29 mmol/L (ref 22–32)
CREATININE: 0.92 mg/dL (ref 0.61–1.24)
Calcium: 9 mg/dL (ref 8.9–10.3)
Glucose, Bld: 97 mg/dL (ref 65–99)
POTASSIUM: 4 mmol/L (ref 3.5–5.1)
SODIUM: 138 mmol/L (ref 135–145)
Total Bilirubin: 0.5 mg/dL (ref 0.3–1.2)
Total Protein: 7.8 g/dL (ref 6.5–8.1)

## 2017-03-14 LAB — CBC WITH DIFFERENTIAL/PLATELET
Basophils Absolute: 0.1 10*3/uL (ref 0–0.1)
Basophils Relative: 1 %
EOS ABS: 0.1 10*3/uL (ref 0–0.7)
Eosinophils Relative: 1 %
HEMATOCRIT: 47.7 % (ref 40.0–52.0)
HEMOGLOBIN: 16.1 g/dL (ref 13.0–18.0)
LYMPHS PCT: 29 %
Lymphs Abs: 3.1 10*3/uL (ref 1.0–3.6)
MCH: 30.4 pg (ref 26.0–34.0)
MCHC: 33.8 g/dL (ref 32.0–36.0)
MCV: 89.9 fL (ref 80.0–100.0)
Monocytes Absolute: 0.6 10*3/uL (ref 0.2–1.0)
Monocytes Relative: 6 %
NEUTROS ABS: 6.8 10*3/uL — AB (ref 1.4–6.5)
NEUTROS PCT: 63 %
Platelets: 252 10*3/uL (ref 150–440)
RBC: 5.3 MIL/uL (ref 4.40–5.90)
RDW: 13 % (ref 11.5–14.5)
WBC: 10.7 10*3/uL — AB (ref 3.8–10.6)

## 2017-04-03 ENCOUNTER — Encounter: Payer: Self-pay | Admitting: Physician Assistant

## 2017-04-03 ENCOUNTER — Ambulatory Visit: Payer: Self-pay | Admitting: Physician Assistant

## 2017-04-03 VITALS — BP 159/100 | HR 107 | Temp 98.5°F | Resp 16

## 2017-04-03 DIAGNOSIS — J0120 Acute ethmoidal sinusitis, unspecified: Secondary | ICD-10-CM

## 2017-04-03 NOTE — Progress Notes (Signed)
   Subjective: URI     Patient ID: Edward Day, male    DOB: 13-Sep-1985, 31 y.o.   MRN: [id number]  HPI Patient complaining of 10 days of nasal congestion, productive cough, sore throat, and body aches. Patient denies fever state he has chills. Patient's the cough increased at night interfering with sleep. Patient denies nausea, vomiting, diarrhea. Patient stated no relief over-the-counter preparations.   Review of Systems Hypertension    Objective:   Physical Exam HEENT multiple for maxillary guarding. Patient has edematous nasal turbinates take rhinorrhea. Pharynx is nonerythematous but has had copious drainage tracks. Neck is supple without adenopathy. Lungs clear to auscultation but increased cough with deep inspirations. Heart is regular rate and rhythm.       Assessment & Plan: Sinusitis and bronchospasm.   Patient given discharge care instructions. Patient given prescription for amoxicillin, Allegra-D, Tessalon Perles, and Tussionex was retaken only at night. Patient advised follow-up in one week if no improvement.

## 2017-04-22 DIAGNOSIS — M25561 Pain in right knee: Secondary | ICD-10-CM | POA: Insufficient documentation

## 2017-05-03 ENCOUNTER — Other Ambulatory Visit: Payer: Self-pay | Admitting: Oncology

## 2017-05-07 ENCOUNTER — Ambulatory Visit: Payer: Self-pay | Admitting: Emergency Medicine

## 2017-05-07 VITALS — BP 150/89 | HR 95 | Temp 98.5°F | Resp 16

## 2017-05-07 DIAGNOSIS — I10 Essential (primary) hypertension: Secondary | ICD-10-CM

## 2017-05-07 NOTE — Progress Notes (Signed)
Subjective.  Patient sent here from physical therapy office for clearance prior to having functional capacity evaluation. He has not been taking his blood pressure medication. He has been off of this for 1 month. He does have a prescription at the pharmacy to pick up. He is undergoing therapy on his right knee status post surgery. He is currently on losartan 100 mg 1 a day. He also has a diagnosis of suspected sleep apnea but was not able to tolerate a sleep study. Complicating history is that he suffered a DVT after surgery and is currently on [place] toe and continues to smoke. Objective  overweight gentleman in no acute distress. Neck supple. Chest occasional rhonchi. Heart regular rate and rhythm. Extremity no edema no calf tenderness. Assessment. Status post right knee surgery currently going to rehabilitation. He has been off of his blood pressure medication recent blood pressure at their office was 151/104. He needs clearance to continue therapy. I did give him clearance to continue his therapy. I did not give him clearance for an FCE examination. He will need to be on his medication and get better blood pressure control prior to going through the complete evaluation. Plan Resume blood pressure medication. Recheck one week on medication to see if he is having improvement. Given clearance to resume therapy on his knee. Not cleared for FCE until better blood pressure control. Needs to stop smoking and have suspicion of  sleep apnea checked

## 2017-05-13 ENCOUNTER — Ambulatory Visit
Admission: RE | Admit: 2017-05-13 | Discharge: 2017-05-13 | Disposition: A | Discharge status: Home / Self Care | Payer: Worker's Compensation | Source: Ambulatory Visit | Attending: Oncology | Admitting: Oncology

## 2017-05-13 DIAGNOSIS — I2699 Other pulmonary embolism without acute cor pulmonale: Secondary | ICD-10-CM | POA: Diagnosis present

## 2017-05-13 DIAGNOSIS — I82401 Acute embolism and thrombosis of unspecified deep veins of right lower extremity: Secondary | ICD-10-CM | POA: Diagnosis not present

## 2017-05-13 DIAGNOSIS — Z6841 Body Mass Index (BMI) 40.0 and over, adult: Secondary | ICD-10-CM | POA: Insufficient documentation

## 2017-05-14 ENCOUNTER — Encounter: Payer: Self-pay | Admitting: Physician Assistant

## 2017-05-14 ENCOUNTER — Ambulatory Visit: Payer: Self-pay

## 2017-05-14 ENCOUNTER — Ambulatory Visit: Payer: Self-pay | Admitting: Physician Assistant

## 2017-05-14 VITALS — BP 120/80 | HR 105 | Temp 98.5°F | Resp 16

## 2017-05-14 DIAGNOSIS — I10 Essential (primary) hypertension: Secondary | ICD-10-CM

## 2017-05-14 NOTE — Progress Notes (Signed)
   Subjective: Medical clearance    Patient ID: Edward Day, male    DOB: 02-09-1986, 32 y.o.   MRN: [id number]  HPI Patient here today follow-up for medical clearance second elevated blood pressure on last exam.  Patient was out of his blood pressure medication on his last visit and blood pressure was elevated.  Patient has restarted blood pressure and is now within normal readings.   Review of Systems Hypertension    Objective:   Physical Exam See vital signs.       Assessment & Plan: Controlled hypertension  Patient advised normal tensive readings normotensive readings today.  Continue medications and follow-up PCP.

## 2017-05-23 NOTE — Progress Notes (Signed)
Hematology/Oncology  Follow up note [facility] [phone] [phone]  CONSULT NOTE Patient Care Team: Weldon Inches as PCP - General (Physician Assistant) Christene Lye, MD (General Surgery) Caryn Section Linden Dolin, PA-C as Physician Assistant (Physician Assistant)  CHIEF COMPLAINTS/PURPOSE OF CONSULTATION:  Follow up for management of anticoagulation.   HISTORY OF PRESENTING ILLNESS:  Edward Day 32 y.o.  male with past medical history listed as below who was referred by [place] to me for evaluation of recently diagnosed DVT and PE. Patient had recent right knee anterior cruciate ligament reconstruction on 10/18/2016. he he presented early August due to lower extremity swelling and tenderness, shortness of breath/chest discomfort. Workup including lower extremity venous Doppler revealed DVT, and CT angiography of the chest revealed right lung PE. No right heart straining. Patient was started on Lovenox which later switched to Xarelto.  He had a history of using testosterone supplementation which caused polycythemia. Lab counts recovered after testosterone supplement was stopped. Currently he denies using any supplements.   INTERVAL HISTORY Today patient is accompanied by his Education officer, museum. He reports that he is taking Xarelto 20 mg daily without any bleeding events.  He denies any constitutional symptoms, chest pain, abdominal pain.  ROS:  Review of Systems  Constitutional: Negative for appetite change, diaphoresis and fatigue.  HENT:   Negative for hearing loss.   Eyes: Negative for eye problems.  Respiratory: Negative for chest tightness and shortness of breath.   Cardiovascular: Negative for leg swelling.  Gastrointestinal: Negative for abdominal distention.  Endocrine: Negative for hot flashes.  Genitourinary: Negative for difficulty urinating.   Musculoskeletal: Negative for arthralgias.    Skin: Negative for itching.  Neurological: Negative for dizziness and headaches.  Hematological: Negative for adenopathy.  Psychiatric/Behavioral: The patient is not nervous/anxious.     MEDICAL HISTORY:  Past Medical History:  Diagnosis Date  . DVT (deep venous thrombosis) ([place])   . GERD (gastroesophageal reflux disease)   . H. pylori infection   . Hypertension   . Low testosterone   . Polycythemia   . Pulmonary embolism ([place])     SURGICAL HISTORY: Past Surgical History:  Procedure Laterality Date  . APPENDECTOMY  2003  . ELBOW SURGERY  2014  . FINGER SURGERY Right 2003  . KNEE SURGERY    . SHOULDER SURGERY  2009, 2013, 2013, 2014  . TONSILLECTOMY      SOCIAL HISTORY: Social History   Socioeconomic History  . Marital status: Single    Spouse name: Not on file  . Number of children: Not on file  . Years of education: Not on file  . Highest education level: Not on file  Social Needs  . Financial resource strain: Not on file  . Food insecurity - worry: Not on file  . Food insecurity - inability: Not on file  . Transportation needs - medical: Not on file  . Transportation needs - non-medical: Not on file  Occupational History  . Not on file  Tobacco Use  . Smoking status: Former Smoker    Packs/day: 0.50    Years: 15.00    Pack years: 7.50    Types: Cigarettes  . Smokeless tobacco: Former Systems developer    Types: Snuff  . Tobacco comment: has cut back to one cig every 6 days 09/06/16  Substance and Sexual Activity  . Alcohol use: Yes    Alcohol/week: 0.0 oz  . Drug use: No  . Sexual activity: Yes  Partners: Female  Other Topics Concern  . Not on file  Social History Narrative  . Not on file    FAMILY HISTORY: Family History  Problem Relation Age of Onset  . Hypertension Mother   . Polycythemia Mother   . Hyperlipidemia Mother   . Diabetes Father   . Hypertension Father   . Cancer Maternal Grandmother        breast cancer  . Dementia Maternal  Grandmother   . Diabetes Maternal Grandmother     ALLERGIES:  is allergic to banana.  MEDICATIONS:  Current Outpatient Medications  Medication Sig Dispense Refill  . aspirin EC 81 MG tablet Take 81 mg by mouth daily.    . pantoprazole (PROTONIX) 40 MG tablet Take 1 tablet (40 mg total) by mouth daily. 90 tablet 3  . traZODone (DESYREL) 150 MG tablet TAKE 1 TABLET (150 MG TOTAL) BY MOUTH AT BEDTIME AS NEEDED FOR SLEEP. 30 tablet 6  . losartan (COZAAR) 100 MG tablet Take 1 tablet (100 mg total) by mouth daily. (Patient taking differently: Take 10 mg by mouth daily. ) 90 tablet 3   No current facility-administered medications for this visit.       Marland Kitchen  PHYSICAL EXAMINATION: ECOG PERFORMANCE STATUS: 0 - Asymptomatic Vitals:   05/24/17 1504 05/24/17 1513  BP:  (!) 141/107  Pulse:  93  Resp: 16   Temp:  98 F (36.7 C)   Filed Weights   05/24/17 1504  Weight: (!) 327 lb (148.3 kg)   GENERAL: No distress, well nourished. Obese. HEAD: Normocephalic, No masses, lesions, tenderness or abnormalities  EYES: Conjunctiva are pink, non icteric ENT: External ears normal ,lips , buccal mucosa, and tongue normal and mucous membranes are moist  LYMPH: No palpable cervical and axillary lymphadenopathy  LUNGS: Clear to auscultation, no crackles or wheezes HEART: Regular rate & rhythm, no murmurs, no gallops, S1 normal and S2 normal  ABDOMEN: Abdomen soft, non-tender, normal bowel sounds, I did not appreciate any  masses or organomegaly  MUSCULOSKELETAL: No CVA tenderness and no tenderness on percussion of the back or rib cage.  EXTREMITIES: No edema, no skin discoloration or tenderness NEURO: Alert & oriented, no focal motor/sensory deficits. ;SKIN:  no rashes or significant lesions. Multiple area of skin tattoos   LABORATORY DATA:  I have reviewed the data as listed Lab Results  Component Value Date   WBC 10.7 (H) 03/14/2017   HGB 16.1 03/14/2017   HCT 47.7 03/14/2017   MCV 89.9  03/14/2017   PLT 252 03/14/2017   Recent Labs    09/06/16 1300  11/27/16 1300 03/06/17 1137 03/14/17 1510  NA 148*   < > 143 144 138  K 4.3   < > 4.1 4.5 4.0  CL 106   < > 103 104 101  CO2  --    < > 25 26 29   GLUCOSE 99   < > 122* 97 97  BUN 15   < > 12 12 15   CREATININE 0.84   < > 0.98 0.80 0.92  CALCIUM 9.6   < > 9.6 9.8 9.0  GFRNONAA 116   < > 102 118 >60  GFRAA 134   < > 117 137 >60  PROT 7.3  --  7.6  --  7.8  ALBUMIN 4.6  --  4.5  --  4.2  AST 36  --  38  --  31  ALT 43  --  55*  --  50  ALKPHOS  68  --  74  --  68  BILITOT 0.4  --  0.3  --  0.5   < > = values in this interval not displayed.   Hypercoagulable workup has been ordered by other physicians. Normal protein S and C. Normal antithrombin III activity, negative for factor V Leiden mutation  RADIOGRAPHIC STUDIES: I have personally reviewed the radiological images as listed and agreed with the findings in the report. Vascular ultrasound lower extremity 11/23/2016 Summary:  - Findings consistent with acute deep vein thrombosis involving the   right popliteal vein and right posterial tibial vein. - Acute intramuscular thrombosis noted in the right gastrocnemius and soleal veins. - No evidence of Baker&'s cyst on the right.  05/13/2017 [place] LE Venous Duplex right  No evidence of acute or chronic DVT within the right lower extremity.  ASSESSMENT & PLAN:  1. History of pulmonary embolism   2. History of DVT of lower extremity   3. Morbid obesity with BMI of 40.0-44.9, adult ([place])     # Provoked VTE, he has finished 6 months. Repeat LE venous duplex is negative for acute or chronic DVT. D-dimer negative.  Advise patient to stop Xarelto, and start taking Aspirin 81mg  daily.  If he has long distance car ride or airline flight, advise patient to take 162mg  Aspirin and take breaks during trips.   # Lifestyle modification discussed with patient including weight loss.  All questions were answered. The patient  knows to call the clinic with any problems questions or concerns.  Return of visit:  6 months.  Earlie Server, MD, PhD Hematology Oncology [facility] Pager- [phone] 05/24/2017

## 2017-05-24 ENCOUNTER — Inpatient Hospital Stay: Payer: Managed Care, Other (non HMO) | Attending: Oncology

## 2017-05-24 ENCOUNTER — Inpatient Hospital Stay (HOSPITAL_BASED_OUTPATIENT_CLINIC_OR_DEPARTMENT_OTHER): Payer: Managed Care, Other (non HMO) | Admitting: Oncology

## 2017-05-24 ENCOUNTER — Encounter: Payer: Self-pay | Admitting: Oncology

## 2017-05-24 ENCOUNTER — Other Ambulatory Visit: Payer: Self-pay

## 2017-05-24 VITALS — BP 141/107 | HR 93 | Temp 98.0°F | Resp 16 | Ht 73.0 in | Wt 327.0 lb

## 2017-05-24 DIAGNOSIS — Z6841 Body Mass Index (BMI) 40.0 and over, adult: Secondary | ICD-10-CM | POA: Diagnosis not present

## 2017-05-24 DIAGNOSIS — I10 Essential (primary) hypertension: Secondary | ICD-10-CM | POA: Diagnosis not present

## 2017-05-24 DIAGNOSIS — Z86718 Personal history of other venous thrombosis and embolism: Secondary | ICD-10-CM

## 2017-05-24 DIAGNOSIS — Z86711 Personal history of pulmonary embolism: Secondary | ICD-10-CM | POA: Diagnosis not present

## 2017-05-24 DIAGNOSIS — I82401 Acute embolism and thrombosis of unspecified deep veins of right lower extremity: Secondary | ICD-10-CM

## 2017-05-24 DIAGNOSIS — I2699 Other pulmonary embolism without acute cor pulmonale: Secondary | ICD-10-CM

## 2017-05-24 LAB — CBC WITH DIFFERENTIAL/PLATELET
Basophils Absolute: 0 10*3/uL (ref 0–0.1)
Basophils Relative: 1 %
EOS ABS: 0.1 10*3/uL (ref 0–0.7)
Eosinophils Relative: 1 %
HCT: 49.2 % (ref 40.0–52.0)
Hemoglobin: 16.6 g/dL (ref 13.0–18.0)
LYMPHS ABS: 2.2 10*3/uL (ref 1.0–3.6)
Lymphocytes Relative: 39 %
MCH: 30.6 pg (ref 26.0–34.0)
MCHC: 33.7 g/dL (ref 32.0–36.0)
MCV: 90.7 fL (ref 80.0–100.0)
Monocytes Absolute: 0.5 10*3/uL (ref 0.2–1.0)
Monocytes Relative: 9 %
Neutro Abs: 2.8 10*3/uL (ref 1.4–6.5)
Neutrophils Relative %: 50 %
Platelets: 217 10*3/uL (ref 150–440)
RBC: 5.43 MIL/uL (ref 4.40–5.90)
RDW: 13.8 % (ref 11.5–14.5)
WBC: 5.7 10*3/uL (ref 3.8–10.6)

## 2017-05-24 LAB — FIBRIN DERIVATIVES D-DIMER (ARMC ONLY): FIBRIN DERIVATIVES D-DIMER (ARMC): 271.39 ng{FEU}/mL (ref 0.00–499.00)

## 2017-05-24 LAB — COMPREHENSIVE METABOLIC PANEL
ALT: 47 U/L (ref 17–63)
AST: 37 U/L (ref 15–41)
Albumin: 3.9 g/dL (ref 3.5–5.0)
Alkaline Phosphatase: 56 U/L (ref 38–126)
Anion gap: 8 (ref 5–15)
BUN: 10 mg/dL (ref 6–20)
CO2: 28 mmol/L (ref 22–32)
Calcium: 9 mg/dL (ref 8.9–10.3)
Chloride: 104 mmol/L (ref 101–111)
Creatinine, Ser: 0.85 mg/dL (ref 0.61–1.24)
GFR calc Af Amer: >60 mL/min (ref >60)
GFR calc non Af Amer: >60 mL/min (ref >60)
Glucose, Bld: 95 mg/dL (ref 65–99)
Potassium: 3.4 mmol/L — ABNORMAL LOW (ref 3.5–5.1)
Sodium: 140 mmol/L (ref 135–145)
Total Bilirubin: 0.5 mg/dL (ref 0.3–1.2)
Total Protein: 7.7 g/dL (ref 6.5–8.1)

## 2017-05-24 NOTE — Progress Notes (Signed)
Patient here for follow up no changes from last appt.

## 2017-06-10 ENCOUNTER — Other Ambulatory Visit: Payer: Self-pay | Admitting: Oncology

## 2017-07-29 ENCOUNTER — Ambulatory Visit: Payer: Self-pay | Admitting: Family Medicine

## 2017-07-29 VITALS — BP 152/92 | HR 86 | Temp 97.9°F | Resp 20 | Ht 73.0 in | Wt 327.0 lb

## 2017-07-29 DIAGNOSIS — Z008 Encounter for other general examination: Secondary | ICD-10-CM

## 2017-07-29 DIAGNOSIS — Z0189 Encounter for other specified special examinations: Principal | ICD-10-CM

## 2017-07-29 LAB — GLUCOSE, POCT (MANUAL RESULT ENTRY): POC GLUCOSE: 124 mg/dL — AB (ref 70–99)

## 2017-07-29 NOTE — Progress Notes (Signed)
Subjective: Annual biometrics screening  Patient presents for his annual biometric screening. Patient has a history of hypertension and low testosterone.  Patient also reports a history of DVT and PE following surgery in the past. Patient takes losartan for his hypertension and reports that it is generally very well controlled but that he is stressed today because he is undergoing right ACL surgery tomorrow.  Patient reports he has not been able to sleep for the last 2 days because of his 32-year-old and because of stress leading up to surgery. PCP: None currently. Patient works for the [employer]. Patient denies any other issues or concerns.   Review of Systems Unremarkable  Objective  Physical Exam General: Awake, alert and oriented. No acute distress. Well developed, hydrated and nourished. Appears stated age.  HEENT: Supple neck without adenopathy. Sclera is non-icteric. The ear canal is clear without discharge. The tympanic membrane is normal in appearance with normal landmarks and cone of light. Nasal mucosa is pink and moist. Oral mucosa is pink and moist. The pharynx is normal in appearance without tonsillar swelling or exudates.  Skin: Skin in warm, dry and intact without rashes or lesions. Appropriate color for ethnicity. Cardiac: Heart rate and rhythm are normal. No murmurs, gallops, or rubs are auscultated.  Respiratory: The chest wall is symmetric and without deformity. No signs of respiratory distress. Lung sounds are clear in all lobes bilaterally without rales, ronchi, or wheezes.  Neurological: The patient is awake, alert and oriented to person, place, and time with normal speech.  Memory is normal and thought processes intact. No gait abnormalities are appreciated.  Psychiatric: Appropriate mood and affect.   Assessment Annual biometrics screening  Plan  Lipid panel pending. Encouraged routine visits with primary care provider.  Offered patient resources to  establish care with a primary care provider, patient declined.  Patient plans to establish care with his wife's PCP.   Discussed his elevated blood pressure and recommended monitoring this at home.  Discussed normal values and implications of hypertension.  Patient says he plans to take the rest of the day off work to relax and catch up on sleep so that his blood pressure returns to normal before his surgery.  Advised patient to keep his surgeon informed regarding his blood pressure if it continues to remain elevated.  Nonfasting blood sugar 124 today.

## 2017-07-30 LAB — LIPID PANEL
CHOL/HDL RATIO: 4.2 ratio (ref 0.0–5.0)
Cholesterol, Total: 214 mg/dL — ABNORMAL HIGH (ref 100–199)
HDL: 51 mg/dL (ref >39)
LDL CALC: 129 mg/dL — AB (ref 0–99)
Triglycerides: 171 mg/dL — ABNORMAL HIGH (ref 0–149)
VLDL CHOLESTEROL CAL: 34 mg/dL (ref 5–40)

## 2017-07-30 NOTE — Progress Notes (Signed)
L[place]e [place], Will you call the patient and inform them that their lipid panel came back?  His HDL  ("good cholesterol") and VLDL cholesterol levels are normal.  The total cholesterol is 214, normal values are between 100 and 199.  The triglyceride level is 171, normal values are between 0 and 149.  The LDL cholesterol ("bad cholesterol") is 129, normal values are below 99. These abnormal values increase their risk for cardiovascular disease.  Please advise the patient to discuss this with their primary care provider at his upcoming first visit.

## 2017-09-20 ENCOUNTER — Encounter: Payer: Self-pay | Admitting: Primary Care

## 2017-09-20 ENCOUNTER — Telehealth: Payer: Self-pay | Admitting: Primary Care

## 2017-09-20 ENCOUNTER — Encounter (INDEPENDENT_AMBULATORY_CARE_PROVIDER_SITE_OTHER): Payer: Self-pay

## 2017-09-20 ENCOUNTER — Ambulatory Visit: Payer: Managed Care, Other (non HMO) | Admitting: Primary Care

## 2017-09-20 VITALS — BP 142/98 | HR 82 | Temp 98.0°F | Ht 72.0 in | Wt 317.0 lb

## 2017-09-20 DIAGNOSIS — I2699 Other pulmonary embolism without acute cor pulmonale: Secondary | ICD-10-CM

## 2017-09-20 DIAGNOSIS — K219 Gastro-esophageal reflux disease without esophagitis: Secondary | ICD-10-CM

## 2017-09-20 DIAGNOSIS — F411 Generalized anxiety disorder: Secondary | ICD-10-CM

## 2017-09-20 DIAGNOSIS — G4726 Circadian rhythm sleep disorder, shift work type: Secondary | ICD-10-CM | POA: Insufficient documentation

## 2017-09-20 DIAGNOSIS — I10 Essential (primary) hypertension: Secondary | ICD-10-CM

## 2017-09-20 MED ORDER — CITALOPRAM HYDROBROMIDE 20 MG PO TABS: 20.0000 mg | ORAL_TABLET | Freq: Every day | ORAL | 1 refills | Status: DC

## 2017-09-20 MED ORDER — TRAZODONE HCL 150 MG PO TABS: ORAL_TABLET | ORAL | 0 refills | Status: DC

## 2017-09-20 MED ORDER — LOSARTAN POTASSIUM 100 MG PO TABS
ORAL_TABLET | ORAL | 3 refills | Status: DC
Start: 2017-09-20 — End: 2018-08-08

## 2017-09-20 MED ORDER — PANTOPRAZOLE SODIUM 40 MG PO TBEC
DELAYED_RELEASE_TABLET | ORAL | 3 refills | Status: DC
Start: 2017-09-20 — End: 2018-08-08

## 2017-09-20 NOTE — Assessment & Plan Note (Signed)
Will be establishing with hematology in August 2019 for PE and DVT that occurred postoperatively.

## 2017-09-20 NOTE — Patient Instructions (Signed)
I sent refills for losartan, trazodone, and pantoprazole to your pharmacy.  Start citalopram 20 mg tablets for anxiety. Start by taking 1/2 tablet daily for 6 days, then increase to 1 full tablet thereafter.  Schedule a follow up visit with me in 6 weeks for re-evaluation. Please contact me sooner if you have any problems in the interim.  It was a pleasure to see you today! Please don't hesitate to call or message me with any questions. Welcome to [facility]!

## 2017-09-20 NOTE — Telephone Encounter (Signed)
Opened in error
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
## 2017-09-20 NOTE — Assessment & Plan Note (Addendum)
Managed on Losartan 100 mg daily. Home BP readings stable per patient. Continue to monitor, continue Losartan 100 mg. CMP reviewed from February 2019.

## 2017-09-20 NOTE — Assessment & Plan Note (Signed)
Doing well on pantoprazole 40 mg, continue same.

## 2017-09-20 NOTE — Assessment & Plan Note (Signed)
Symptoms since May 2018.  GAD 7 score of 17 and PHQ 9 score of 10 today. Discussed various options for treatment and he elects for medication.  Rx for Citalopram sent to pharmacy. Patient is to take 1/2 tablet daily for 6 days, then advance to 1 full tablet thereafter. We discussed possible side effects of headache, GI upset, drowsiness, and SI/HI. If thoughts of SI/HI develop, we discussed to present to the emergency immediately. Patient verbalized understanding.   Follow up in 6 weeks for re-evaluation.

## 2017-10-18 ENCOUNTER — Other Ambulatory Visit: Payer: Self-pay | Admitting: Primary Care

## 2017-10-18 DIAGNOSIS — F411 Generalized anxiety disorder: Secondary | ICD-10-CM

## 2017-11-04 ENCOUNTER — Ambulatory Visit: Payer: Self-pay | Admitting: Primary Care

## 2017-11-06 ENCOUNTER — Ambulatory Visit: Payer: Self-pay | Admitting: Primary Care

## 2017-11-06 ENCOUNTER — Ambulatory Visit: Payer: Managed Care, Other (non HMO) | Admitting: Primary Care

## 2017-11-06 ENCOUNTER — Encounter: Payer: Self-pay | Admitting: Primary Care

## 2017-11-06 VITALS — BP 140/92 | HR 104 | Temp 98.3°F | Ht 72.0 in | Wt 321.8 lb

## 2017-11-06 DIAGNOSIS — F411 Generalized anxiety disorder: Secondary | ICD-10-CM

## 2017-11-06 DIAGNOSIS — I10 Essential (primary) hypertension: Secondary | ICD-10-CM | POA: Diagnosis not present

## 2017-11-06 MED ORDER — CITALOPRAM HYDROBROMIDE 40 MG PO TABS: 40.0000 mg | ORAL_TABLET | Freq: Every day | ORAL | 0 refills | Status: DC

## 2017-11-06 NOTE — Progress Notes (Signed)
Subjective:    Patient ID: Edward Day, male    DOB: 01-Jan-1986, 32 y.o.   MRN: [id number]  HPI  Mr. Homan is a 32 year old male who presents today for follow up.  1) GAD: He was last evaluated in early June 2019 with complaints of anxiety and depression. His GAD 7 score was 17 and PHQ 9 score of 10 so he was initiated on Celexa 20 mg.   Since his last visit he felt temporary improvement on Celexa. Positive effects include decrease in worry, chest tightness, and better sleep. Two weeks later his symptoms did plateau, and now his symptoms have returned. Overall he does feel better.  He denies SI/HI. He is compliant to Trazodone 150 mg without improvement.   2) Essential Hypertension: He was also noted to have elevated  blood pressure readings despite management on losartan 100 mg. He endorsed normal home readings last visit.  BP Readings from Last 3 Encounters:  11/06/17 (!) 140/92  09/20/17 (!) 142/98  07/29/17 (!) 152/92   He's checking his BP at home which runs 130's/80's. He endorses a very poor diet and is not exercising due to his chronic knee issues. He denies chest pain, lower extremity edema, dizziness.   Review of Systems  Eyes: Negative for visual disturbance.  Respiratory: Negative for shortness of breath.   Cardiovascular: Negative for chest pain.  Neurological: Negative for dizziness and headaches.  Psychiatric/Behavioral: Negative for suicidal ideas. The patient is nervous/anxious.        Past Medical History:  Diagnosis Date  . DVT (deep venous thrombosis) ([place])   . GAD (generalized anxiety disorder)   . GERD (gastroesophageal reflux disease)   . H. pylori infection   . Hypertension   . Low testosterone   . Polycythemia   . Pulmonary embolism [facility])      Social History   Socioeconomic History  . Marital status: Married    Spouse name: Not on file  . Number of children: Not on file  . Years of education: Not on file  . Highest education level: Not  on file  Occupational History  . Not on file  Social Needs  . Financial resource strain: Not on file  . Food insecurity:    Worry: Not on file    Inability: Not on file  . Transportation needs:    Medical: Not on file    Non-medical: Not on file  Tobacco Use  . Smoking status: Former Smoker    Packs/day: 0.50    Years: 15.00    Pack years: 7.50    Types: Cigarettes    Last attempt to quit: 05/24/2016    Years since quitting: 1.4  . Smokeless tobacco: Former Systems developer    Types: Snuff  . Tobacco comment: has cut back to one cig every 6 days 09/06/16  Substance and Sexual Activity  . Alcohol use: Yes    Alcohol/week: 0.0 oz  . Drug use: No  . Sexual activity: Yes    Partners: Female  Lifestyle  . Physical activity:    Days per week: Not on file    Minutes per session: Not on file  . Stress: Not on file  Relationships  . Social connections:    Talks on phone: Not on file    Gets together: Not on file    Attends religious service: Not on file    Active member of club or organization: Not on file    Attends meetings of clubs  or organizations: Not on file    Relationship status: Not on file  . Intimate partner violence:    Fear of current or ex partner: Not on file    Emotionally abused: Not on file    Physically abused: Not on file    Forced sexual activity: Not on file  Other Topics Concern  . Not on file  Social History Narrative   Married.   1 child.   Works for [employer].   Enjoys going to the lake, riding motor cycles.    Past Surgical History:  Procedure Laterality Date  . APPENDECTOMY  2003  . ELBOW SURGERY  2014  . FINGER SURGERY Right 2003  . KNEE SURGERY    . SHOULDER SURGERY  2009, 2013, 2013, 2014  . TONSILLECTOMY      Family History  Problem Relation Age of Onset  . Hypertension Mother   . Polycythemia Mother   . Hyperlipidemia Mother   . Diabetes Father   . Hypertension Father   . Cancer Maternal Grandmother         breast cancer  . Dementia Maternal Grandmother   . Diabetes Maternal Grandmother     Allergies  Allergen Reactions  . Banana Swelling    Current Outpatient Medications on File Prior to Visit  Medication Sig Dispense Refill  . losartan (COZAAR) 100 MG tablet Take 1 tablet by mouth once daily for blood pressure. 90 tablet 3  . pantoprazole (PROTONIX) 40 MG tablet Take 1 tablet by mouth once daily for heartburn. 90 tablet 3  . traZODone (DESYREL) 150 MG tablet Take 1 tablet by mouth at bedtime for insomnia. 90 tablet 0   No current facility-administered medications on file prior to visit.     BP (!) 140/92   Pulse (!) 104   Temp 98.3 F (36.8 C) (Oral)   Ht 6' (1.829 m)   Wt (!) 321 lb 12 oz (145.9 kg)   SpO2 96%   BMI 43.64 kg/m    Objective:   Physical Exam  Constitutional: He appears well-nourished.  Neck: Neck supple.  Cardiovascular: Normal rate and regular rhythm.  Respiratory: Effort normal and breath sounds normal.  Skin: Skin is warm and dry.  Psychiatric: He has a normal mood and affect.           Assessment & Plan:

## 2017-11-06 NOTE — Patient Instructions (Signed)
We've increased the dose of your citalopram to 40 mg. You may take two of the 20 mg tablets to equal 40 until your bottle is empty.  Start exercising. You should be getting 150 minutes of moderate intensity exercise weekly.  It's important to improve your diet by reducing consumption of fast food, fried food, processed snack foods, sugary drinks. Increase consumption of fresh vegetables and fruits, whole grains, water.  Ensure you are drinking 64 ounces of water daily.  Continue to monitor your blood pressure and report consistent readings at or above 135/90.  Please call me in 4 weeks with an update on the increased dose of Citalopram.  It was a pleasure to see you today!

## 2017-11-06 NOTE — Assessment & Plan Note (Signed)
Long discussion today about the importance of BP control. Recommended he work on diet and start exercising. He will continue to monitor BP and report readings at or above 135/90. Continue losartan.

## 2017-11-06 NOTE — Assessment & Plan Note (Signed)
Overall improved on Citalopram but not at goal. Increase dose to 40 mg, new Rx sent to pharmacy. Consider discontinuing trazodone given risks for serotonin syndrome, especially if no improvement. He will update in 4 weeks.

## 2017-11-28 ENCOUNTER — Inpatient Hospital Stay: Payer: Managed Care, Other (non HMO) | Admitting: Oncology

## 2017-12-04 ENCOUNTER — Telehealth: Payer: Self-pay | Admitting: Primary Care

## 2017-12-04 NOTE — Telephone Encounter (Signed)
Message left for patient to return my call.  

## 2017-12-04 NOTE — Telephone Encounter (Addendum)
-----   Message from Pleas Koch, NP sent at 11/06/2017  4:05 PM EDT ----- Regarding: Anxiety Will you please call patient and ask how's he doing since we increased his citalopram from 20 mg to 40 mg? Is he still taking the trazodone?

## 2017-12-10 NOTE — Telephone Encounter (Signed)
Spoken to patient and he stated that he is doing well. The increased is doing awesome for him per patient. Yes, he is still taking trazodone.

## 2017-12-10 NOTE — Telephone Encounter (Signed)
Noted and glad to hear! 

## 2017-12-18 ENCOUNTER — Encounter (HOSPITAL_COMMUNITY): Payer: Self-pay | Admitting: Emergency Medicine

## 2017-12-18 ENCOUNTER — Encounter: Payer: Self-pay | Admitting: Family Medicine

## 2017-12-18 ENCOUNTER — Ambulatory Visit: Payer: Managed Care, Other (non HMO) | Admitting: Family Medicine

## 2017-12-18 ENCOUNTER — Emergency Department (HOSPITAL_COMMUNITY)
Admission: EM | Admit: 2017-12-18 | Discharge: 2017-12-18 | Disposition: A | Discharge status: Home / Self Care | Payer: Managed Care, Other (non HMO) | Attending: Emergency Medicine | Admitting: Emergency Medicine

## 2017-12-18 VITALS — BP 128/84 | HR 110 | Temp 98.2°F | Ht 72.0 in | Wt 319.8 lb

## 2017-12-18 DIAGNOSIS — Z87891 Personal history of nicotine dependence: Secondary | ICD-10-CM | POA: Insufficient documentation

## 2017-12-18 DIAGNOSIS — Z79899 Other long term (current) drug therapy: Secondary | ICD-10-CM | POA: Diagnosis not present

## 2017-12-18 DIAGNOSIS — L0211 Cutaneous abscess of neck: Secondary | ICD-10-CM

## 2017-12-18 DIAGNOSIS — I10 Essential (primary) hypertension: Secondary | ICD-10-CM | POA: Insufficient documentation

## 2017-12-18 DIAGNOSIS — L0291 Cutaneous abscess, unspecified: Secondary | ICD-10-CM

## 2017-12-18 DIAGNOSIS — M542 Cervicalgia: Secondary | ICD-10-CM | POA: Diagnosis present

## 2017-12-18 MED ORDER — DOXYCYCLINE HYCLATE 100 MG PO CAPS: 100.0000 mg | ORAL_CAPSULE | Freq: Two times a day (BID) | ORAL | 0 refills | Status: DC

## 2017-12-18 MED ORDER — DOXYCYCLINE HYCLATE 100 MG PO TABS
100.0000 mg | ORAL_TABLET | Freq: Once | ORAL | Status: AC
  Administered 2017-12-18: 100 mg via ORAL
  Filled 2017-12-18: qty 1

## 2017-12-18 MED ORDER — LIDOCAINE HCL 2 % IJ SOLN
20.0000 mL | Freq: Once | INTRAMUSCULAR | Status: AC
  Administered 2017-12-18: 400 mg
  Filled 2017-12-18: qty 20

## 2017-12-18 NOTE — ED Provider Notes (Addendum)
[facility] EMERGENCY DEPARTMENT Provider Note   CSN: [id number] Arrival date & time: 12/18/17  1542     History   Chief Complaint Chief Complaint  Patient presents with  . Abscess    HPI Edward Day is a 32 y.o. male.  HPI    32 year old male presents today with complaints of abscess to his left neck. Patient reports symptoms started 2 days ago and have progressed. He notes he attempted to open this by popping it, also by using a needle and alcohol. Patient denies fever at home but notes he has felt hot. He denies any history of the same or significant skin infections. No other complaints today.    Past Medical History:  Diagnosis Date  . DVT (deep venous thrombosis) ([place])   . GAD (generalized anxiety disorder)   . GERD (gastroesophageal reflux disease)   . H. pylori infection   . Hypertension   . Low testosterone   . Polycythemia   . Pulmonary embolism [facility])     Patient Active Problem List   Diagnosis Date Noted  . GAD (generalized anxiety disorder) 09/20/2017  . Gastroesophageal reflux disease 09/20/2017  . 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 Pain in right knee 04/22/2017  . Pulmonary embolism ([place]) 11/23/2016  . Chest pain 11/11/2013  . Hyperlipidemia 11/11/2013  . Essential hypertension 09/25/2006    Past Surgical History:  Procedure Laterality Date  . APPENDECTOMY  2003  . ELBOW SURGERY  2014  . FINGER SURGERY Right 2003  . KNEE SURGERY    . SHOULDER SURGERY  2009, 2013, 2013, 2014  . TONSILLECTOMY          Home Medications    Prior to Admission medications   Medication Sig Start Date End Date Taking? Authorizing Provider  citalopram (CELEXA) 40 MG tablet Take 1 tablet (40 mg total) by mouth daily. 11/06/17   Pleas Koch, NP  doxycycline (VIBRAMYCIN) 100 MG capsule Take 1 capsule (100 mg total) by mouth 2 (two) times daily. 12/18/17   Jahzara Slattery, Dellis Filbert, PA-C  HYDROmorphone (DILAUDID) 4 MG tablet Take by mouth every 4  (four) hours as needed for severe pain.    [provider]  losartan (COZAAR) 100 MG tablet Take 1 tablet by mouth once daily for blood pressure. 09/20/17   Pleas Koch, NP  pantoprazole (PROTONIX) 40 MG tablet Take 1 tablet by mouth once daily for heartburn. 09/20/17   Pleas Koch, NP  traZODone (DESYREL) 150 MG tablet Take 1 tablet by mouth at bedtime for insomnia. 09/20/17   Pleas Koch, NP    Family History Family History  Problem Relation Age of Onset  . Hypertension Mother   . Polycythemia Mother   . Hyperlipidemia Mother   . Diabetes Father   . Hypertension Father   . Cancer Maternal Grandmother        breast cancer  . Dementia Maternal Grandmother   . Diabetes Maternal Grandmother     Social History Social History   Tobacco Use  . Smoking status: Former Smoker    Packs/day: 0.50    Years: 15.00    Pack years: 7.50    Types: Cigarettes    Last attempt to quit: 05/24/2016    Years since quitting: 1.5  . Smokeless tobacco: Former Systems developer    Types: Snuff  . Tobacco comment: has cut back to one cig every 6 days 09/06/16  Substance Use Topics  . Alcohol use: Yes  Alcohol/week: 0.0 standard drinks  . Drug use: No     Allergies   Banana   Review of Systems Review of Systems  All other systems reviewed and are negative.   Physical Exam Updated Vital Signs BP (!) 159/113 (BP Location: Left Arm)   Pulse (!) 113   Temp 98.8 F (37.1 C) (Oral)   Resp 18   SpO2 100%   Physical Exam  Constitutional: He is oriented to person, place, and time. He appears well-developed and well-nourished.  HENT:  Head: Normocephalic and atraumatic.  Eyes: Pupils are equal, round, and reactive to light. Conjunctivae are normal. Right eye exhibits no discharge. Left eye exhibits no discharge. No scleral icterus.  Neck: Normal range of motion. No JVD present. No tracheal deviation present.  Pulmonary/Chest: Effort normal. No stridor.  Musculoskeletal:  1.5 cm  area of induration to the left lateral neck  Neurological: He is alert and oriented to person, place, and time. Coordination normal.  Psychiatric: He has a normal mood and affect. His behavior is normal. Judgment and thought content normal.  Nursing note and vitals reviewed.    ED Treatments / Results  Labs (all labs ordered are listed, but only abnormal results are displayed) Labs Reviewed - No data to display  EKG None  Radiology No results found.  Procedures Procedures (including critical care time)  EMERGENCY DEPARTMENT US SOFT TISSUE INTERPRETATION "Study: Limited Soft Tissue Ultrasound"  INDICATIONS: Soft tissue infection Multiple views of the body part were obtained in real-time with a multi-frequency linear probe  PERFORMED BY: Myself IMAGES ARCHIVED?: Yes SIDE:Left BODY PART:Neck INTERPRETATION:  Abcess present and Cellulitis present   INCISION AND DRAINAGE Performed by: Stevie Kern Analaura Messler Consent: Verbal consent obtained. Risks and benefits: risks, benefits and alternatives were discussed Type: abscess  Body area: left neck  Anesthesia: local infiltration  Incision was made with a scalpel.  Local anesthetic: lidocaine 2% 0 epinephrine  Anesthetic total: 3 ml  Complexity: complex Blunt dissection to break up loculations  Drainage: purulent  Drainage amount: less than 1 cc  Packing material: none  Patient tolerance: Patient tolerated the procedure well with no immediate complications.    Medications Ordered in ED Medications  doxycycline (VIBRA-TABS) tablet 100 mg (has no administration in time range)  lidocaine (XYLOCAINE) 2 % (with pres) injection 400 mg (400 mg Infiltration Given 12/18/17 1646)     Initial Impression / Assessment and Plan / ED Course  I have reviewed the triage vital signs and the nursing notes.  Pertinent labs & imaging results that were available during my care of the patient were reviewed by me and considered in my  medical decision making (see chart for details).     Labs:   Imaging:  Consults:  Therapeutics: doxycycline  Discharge Meds: doxycycline  Assessment/Plan: 52 YOF presents today with abscess to his neck. This is to be localized infection, patient is afebrile nontoxic appearing. Bedside ultrasound shows cobblestoning with questionable fluid collection. Bedside I&D shows very minor purulent drainage, mostly surrounding induration. Patient will be started on antibiotics, he will follow-up in 36 hours if symptoms are not improving or immediately if they worsen. Both patient and his mother verbalized understanding and agreement to today's plan had no further questions or concerns at the time discharge.   Final Clinical Impressions(s) / ED Diagnoses   Final diagnoses:  Abscess    ED Discharge Orders         Ordered    doxycycline (VIBRAMYCIN) 100 MG capsule  2 times daily     12/18/17 1748           Francee Gentile 12/18/17 1748    Maudie Flakes, MD 12/18/17 2237    Okey Regal, PA-C 03/10/18 1358    Maudie Flakes, MD 03/10/18 1455

## 2017-12-18 NOTE — Discharge Instructions (Addendum)
Please read attached information. If you experience any new or worsening signs or symptoms please return to the emergency room for evaluation. Please follow-up with your primary care provider or specialist as discussed. Please use medication prescribed only as directed and discontinue taking if you have any concerning signs or symptoms.   °

## 2017-12-18 NOTE — Patient Instructions (Signed)
You have a serious abscess of your neck   Please go to [facility] ER for evaluation and treatment

## 2017-12-18 NOTE — Progress Notes (Signed)
   Subjective:    Patient ID: Edward Day, male    DOB: Apr 06, 1986, 32 y.o.   MRN: [id number]  HPI This is a 32 yo male who presents today with a "knot on the side of his neck" for 4 days. Has come up suddenly. No known fever but has been sweating this afternoon. Some drainage, sore.   Past Medical History:  Diagnosis Date  . DVT (deep venous thrombosis) ([place])   . GAD (generalized anxiety disorder)   . GERD (gastroesophageal reflux disease)   . H. pylori infection   . Hypertension   . Low testosterone   . Polycythemia   . Pulmonary embolism [facility])    Past Surgical History:  Procedure Laterality Date  . APPENDECTOMY  2003  . ELBOW SURGERY  2014  . FINGER SURGERY Right 2003  . KNEE SURGERY    . SHOULDER SURGERY  2009, 2013, 2013, 2014  . TONSILLECTOMY     Family History  Problem Relation Age of Onset  . Hypertension Mother   . Polycythemia Mother   . Hyperlipidemia Mother   . Diabetes Father   . Hypertension Father   . Cancer Maternal Grandmother        breast cancer  . Dementia Maternal Grandmother   . Diabetes Maternal Grandmother    Social History   Tobacco Use  . Smoking status: Former Smoker    Packs/day: 0.50    Years: 15.00    Pack years: 7.50    Types: Cigarettes    Last attempt to quit: 05/24/2016    Years since quitting: 1.5  . Smokeless tobacco: Former Systems developer    Types: Snuff  . Tobacco comment: has cut back to one cig every 6 days 09/06/16  Substance Use Topics  . Alcohol use: Yes    Alcohol/week: 0.0 standard drinks  . Drug use: No      Review of Systems Per HPI    Objective:   Physical Exam  Constitutional: He is oriented to person, place, and time. He appears well-developed and well-nourished. No distress.  HENT:  Large area of swelling left side of neck below mandible. Central area of pus.   Cardiovascular: Tachycardia present.  Pulmonary/Chest: Effort normal.  Neurological: He is alert and oriented to person, place, and time.  Skin:  Skin is warm. He is diaphoretic.  Psychiatric: He has a normal mood and affect. His behavior is normal. Judgment and thought content normal.  Vitals reviewed.   BP 128/84   Pulse (!) 110   Temp 98.2 F (36.8 C) (Oral)   Ht 6' (1.829 m)   Wt (!) 319 lb 12 oz (145 kg)   BMI 43.37 kg/m  Wt Readings from Last 3 Encounters:  12/18/17 (!) 319 lb 12 oz (145 kg)  11/06/17 (!) 321 lb 12 oz (145.9 kg)  09/20/17 (!) 317 lb (143.8 kg)         Assessment & Plan:  1. Neck abscess - discussed with Dr. Lorelei Pont who also examined patient, agreed to send him directly to ER. Patient wishes to go to [facility].  - Called and spoke with triage nurse to advise of patient's arrival   Clarene Reamer, [facility]  [facility], [place]  12/18/2017 3:18 PM

## 2017-12-18 NOTE — ED Triage Notes (Signed)
Pt to ER for evaluation of left lateral neck abscess onset 2 days ago, reports this started as an ingrown hair.

## 2017-12-18 NOTE — ED Provider Notes (Signed)
Patient placed in Quick Look pathway, seen and evaluated   Chief Complaint: Left neck redness, pain, and swelling  HPI:   32 year old male presenting from primary care with a chief complaint of neck abscess.  Onset 2 days ago.  He reports that he attempted to open the area and drain it with a needle that he applied alcohol and sterilized with a flame.  He denies fever or chills.  ROS: Neck pain, redness, swelling  Physical Exam:   Gen: No distress  Neuro: Awake and Alert  Skin: Warm    Focused Exam: Erythema, warmth, and induration noted to the left anterolateral neck with a central ulceration.  No active drainage.  Left anteriolateral neck      Initiation of care has begun. The patient has been counseled on the process, plan, and necessity for staying for the completion/evaluation, and the remainder of the medical screening examination    Joanne Gavel, PA-C 12/18/17 [place], MD 01/02/18 1047

## 2017-12-19 ENCOUNTER — Other Ambulatory Visit: Payer: Self-pay | Admitting: Primary Care

## 2017-12-19 DIAGNOSIS — G4726 Circadian rhythm sleep disorder, shift work type: Secondary | ICD-10-CM

## 2017-12-20 NOTE — Telephone Encounter (Signed)
Last prescribed on 09/20/2017  Last office visit on 12/18/2017 with Debbie for acute

## 2017-12-20 NOTE — Telephone Encounter (Signed)
Refill sent to pharmacy.   

## 2018-01-30 ENCOUNTER — Ambulatory Visit: Payer: Managed Care, Other (non HMO) | Admitting: Internal Medicine

## 2018-01-30 ENCOUNTER — Ambulatory Visit: Payer: Self-pay | Admitting: *Deleted

## 2018-01-30 ENCOUNTER — Encounter: Payer: Self-pay | Admitting: Internal Medicine

## 2018-01-30 ENCOUNTER — Encounter (INDEPENDENT_AMBULATORY_CARE_PROVIDER_SITE_OTHER): Payer: Self-pay

## 2018-01-30 VITALS — BP 138/90 | HR 112 | Temp 99.1°F | Wt 326.0 lb

## 2018-01-30 DIAGNOSIS — R52 Pain, unspecified: Secondary | ICD-10-CM

## 2018-01-30 DIAGNOSIS — R6883 Chills (without fever): Secondary | ICD-10-CM | POA: Diagnosis not present

## 2018-01-30 DIAGNOSIS — J029 Acute pharyngitis, unspecified: Secondary | ICD-10-CM

## 2018-01-30 DIAGNOSIS — R509 Fever, unspecified: Secondary | ICD-10-CM

## 2018-01-30 MED ORDER — METHYLPREDNISOLONE ACETATE 80 MG/ML IJ SUSP
80.0000 mg | Freq: Once | INTRAMUSCULAR | Status: AC
  Administered 2018-01-30: 80 mg via INTRAMUSCULAR

## 2018-01-30 MED ORDER — HYDROCODONE-HOMATROPINE 5-1.5 MG/5ML PO SYRP: 5.0000 mL | ORAL_SOLUTION | Freq: Three times a day (TID) | ORAL | 0 refills | Status: DC | PRN

## 2018-01-30 MED ORDER — AMOXICILLIN-POT CLAVULANATE 875-125 MG PO TABS: 1.0000 | ORAL_TABLET | Freq: Two times a day (BID) | ORAL | 0 refills | Status: DC

## 2018-01-30 NOTE — Telephone Encounter (Signed)
Pt called with complaints of sore throat 01/28/18, he says that over the past 2 days he had chills, body aches, and soreness from coughing, ear pain, and headache; his temp was 100.9 - 103.0; this morning it was 100.0 at 0715; the pt took tylenol 1000 mg tylenol 01/29/18 at 2300 & 0600 01/30/18; recommendations made per nurse triage protocol; pt previously offered and accepted appointment with Webb Silversmith, [place], 01/30/18 at 1030 per [facility] agent Constance Holster; he normally sees Alma Friendly but she has no availability; will route to office for notification of this upcoming appointment.   Reason for Disposition . SEVERE (e.g., excruciating) throat pain  Answer Assessment - Initial Assessment Questions 1. ONSET: "When did the throat start hurting?" (Hours or days ago)      01/28/18 2. SEVERITY: "How bad is the sore throat?" (Scale 1-10; mild, moderate or severe)   - MILD (1-3):  doesn't interfere with eating or normal activities   - MODERATE (4-7): interferes with eating some solids and normal activities   - SEVERE (8-10):  excruciating pain, interferes with most normal activities   - SEVERE DYSPHAGIA: can't swallow liquids, drooling     severe 3. STREP EXPOSURE: "Has there been any exposure to strep within the past week?" If so, ask: "What type of contact occurred?"      no 4.  VIRAL SYMPTOMS: "Are there any symptoms of a cold, such as a runny nose, cough, hoarse voice or red eyes?"      Body aches, fever, bil ear pain, cough 5. FEVER: "Do you have a fever?" If so, ask: "What is your temperature, how was it measured, and when did it start?"     Yes 100.0 - 103.0 taken with digital oral and tympanic thermometer 6. PUS ON THE TONSILS: "Is there pus on the tonsils in the back of your throat?"     Unable to visualize 7. OTHER SYMPTOMS: "Do you have any other symptoms?" (e.g., difficulty breathing, headache, rash)     Headache, soreness from coughing 8. PREGNANCY: "Is there any chance you are  pregnant?" "When was your last menstrual period?"     n/a  Protocols used: SORE THROAT-A-AH

## 2018-01-30 NOTE — Progress Notes (Signed)
Subjective:    Patient ID: Edward Day, male    DOB: [birth year], 32 y.o.   MRN: [id number]  HPI  Pt presents to the clinic today with c/o sore throat, fever, cough, chills and body aches. He reports this started 2 days ago. He is having difficulty swallowing. His fever has been as high as 102-103 at home. He denies runny nose, nasal congestion, ear pain, or shortness of breath. He has tried Tylenol and ASA with some relief. He has not had sick contacts that he is aware of.  Review of Systems      Past Medical History:  Diagnosis Date  . DVT (deep venous thrombosis) ([place])   . GAD (generalized anxiety disorder)   . GERD (gastroesophageal reflux disease)   . H. pylori infection   . Hypertension   . Low testosterone   . Polycythemia   . Pulmonary embolism ([place])     Current Outpatient Medications  Medication Sig Dispense Refill  . citalopram (CELEXA) 40 MG tablet Take 1 tablet (40 mg total) by mouth daily. 90 tablet 0  . losartan (COZAAR) 100 MG tablet Take 1 tablet by mouth once daily for blood pressure. 90 tablet 3  . pantoprazole (PROTONIX) 40 MG tablet Take 1 tablet by mouth once daily for heartburn. 90 tablet 3  . traZODone (DESYREL) 150 MG tablet TAKE 1 TABLET BY MOUTH AT BEDTIME FOR INSOMNIA 90 tablet 0   No current facility-administered medications for this visit.     Allergies  Allergen Reactions  . Banana Swelling    Family History  Problem Relation Age of Onset  . Hypertension Mother   . Polycythemia Mother   . Hyperlipidemia Mother   . Diabetes Father   . Hypertension Father   . Cancer Maternal Grandmother        breast cancer  . Dementia Maternal Grandmother   . Diabetes Maternal Grandmother     Social History   Socioeconomic History  . Marital status: Married    Spouse name: Not on file  . Number of children: Not on file  . Years of education: Not on file  . Highest education level: Not on file  Occupational History  . Not on file  Social  Needs  . Financial resource strain: Not on file  . Food insecurity:    Worry: Not on file    Inability: Not on file  . Transportation needs:    Medical: Not on file    Non-medical: Not on file  Tobacco Use  . Smoking status: Former Smoker    Packs/day: 0.50    Years: 15.00    Pack years: 7.50    Types: Cigarettes    Last attempt to quit: 05/24/2016    Years since quitting: 1.6  . Smokeless tobacco: Former Systems developer    Types: Snuff  . Tobacco comment: has cut back to one cig every 6 days 09/06/16  Substance and Sexual Activity  . Alcohol use: Yes    Alcohol/week: 0.0 standard drinks  . Drug use: No  . Sexual activity: Yes    Partners: Female  Lifestyle  . Physical activity:    Days per week: Not on file    Minutes per session: Not on file  . Stress: Not on file  Relationships  . Social connections:    Talks on phone: Not on file    Gets together: Not on file    Attends religious service: Not on file    Active member of  club or organization: Not on file    Attends meetings of clubs or organizations: Not on file    Relationship status: Not on file  . Intimate partner violence:    Fear of current or ex partner: Not on file    Emotionally abused: Not on file    Physically abused: Not on file    Forced sexual activity: Not on file  Other Topics Concern  . Not on file  Social History Narrative   Married.   1 child.   Works for [employer].   Enjoys going to the lake, riding motor cycles.     Constitutional: Pt reports fever and chills. Denies malaise, fatigue, headache or abrupt weight changes.  HEENT: Pt reports sore throat. Denies eye pain, eye redness, ear pain, ringing in the ears, wax buildup, runny nose, nasal congestion, bloody nose. Respiratory: Pt reports cough. Denies difficulty breathing, shortness of breath, or sputum production.   Cardiovascular: Denies chest pain, chest tightness, palpitations or swelling in the hands or feet.    Musculoskeletal: Pt reports body aches. Denies decrease in range of motion, difficulty with gait, muscle pain or joint pain and swelling.  Skin: Denies redness, rashes, lesions or ulcercations.   No other specific complaints in a complete review of systems (except as listed in HPI above).  Objective:   Physical Exam   Pulse (!) 112   Temp 99.1 F (37.3 C) (Oral)   Wt (!) 326 lb (147.9 kg)   SpO2 97%   BMI 44.21 kg/m  Wt Readings from Last 3 Encounters:  01/30/18 (!) 326 lb (147.9 kg)  12/18/17 (!) 319 lb 12 oz (145 kg)  11/06/17 (!) 321 lb 12 oz (145.9 kg)    General: Appears his stated age, obese, in NAD. Skin: Warm, dry and intact. No rashes noted. HEENT:  Throat/Mouth: Teeth present, mucosa pink and moist, no exudate, lesions or ulcerations noted.  Neck:  Anterior cervical adenopathy noted on the left. Cardiovascular: Tachycardic with normal rhythm. S1,S2 noted.  No murmur, rubs or gallops noted.  Pulmonary/Chest: Normal effort and positive vesicular breath sounds. No respiratory distress. No wheezes, rales or ronchi noted.  Neurological: Alert and oriented.    BMET    Component Value Date/Time   NA 140 05/24/2017 1431   NA 144 03/06/2017 1137   NA 139 11/07/2013 0420   K 3.4 (L) 05/24/2017 1431   K 3.6 11/07/2013 0420   CL 104 05/24/2017 1431   CL 103 11/07/2013 0420   CO2 28 05/24/2017 1431   CO2 30 11/07/2013 0420   GLUCOSE 95 05/24/2017 1431   GLUCOSE 91 11/07/2013 0420   BUN 10 05/24/2017 1431   BUN 12 03/06/2017 1137   BUN 8 11/07/2013 0420   CREATININE 0.85 05/24/2017 1431   CREATININE 0.94 11/07/2013 0420   CALCIUM 9.0 05/24/2017 1431   CALCIUM 8.2 (L) 11/07/2013 0420   GFRNONAA >60 05/24/2017 1431   GFRNONAA >60 11/07/2013 0420   GFRAA >60 05/24/2017 1431   GFRAA >60 11/07/2013 0420    Lipid Panel     Component Value Date/Time   CHOL 214 (H) 07/29/2017 0941   TRIG 171 (H) 07/29/2017 0941   HDL 51 07/29/2017 0941   CHOLHDL 4.2 07/29/2017  0941   LDLCALC 129 (H) 07/29/2017 0941    CBC    Component Value Date/Time   WBC 5.7 05/24/2017 1431   RBC 5.43 05/24/2017 1431   HGB 16.6 05/24/2017 1431   HGB 16.3 03/06/2017  1137   HCT 49.2 05/24/2017 1431   HCT 48.3 03/06/2017 1137   PLT 217 05/24/2017 1431   PLT 266 03/06/2017 1137   MCV 90.7 05/24/2017 1431   MCV 91 03/06/2017 1137   MCV 96 11/07/2013 0420   MCH 30.6 05/24/2017 1431   MCHC 33.7 05/24/2017 1431   RDW 13.8 05/24/2017 1431   RDW 13.8 03/06/2017 1137   RDW 13.7 11/07/2013 0420   LYMPHSABS 2.2 05/24/2017 1431   LYMPHSABS 2.4 03/06/2017 1137   LYMPHSABS 3.7 (H) 11/07/2013 0420   MONOABS 0.5 05/24/2017 1431   MONOABS 1.0 11/07/2013 0420   EOSABS 0.1 05/24/2017 1431   EOSABS 0.1 03/06/2017 1137   EOSABS 0.1 11/07/2013 0420   BASOSABS 0.0 05/24/2017 1431   BASOSABS 0.0 03/06/2017 1137   BASOSABS 0.1 11/07/2013 0420    Hgb A1C Lab Results  Component Value Date   HGBA1C 5.4 09/06/2016          Assessment & Plan:   Sore Throat, Fever, Chills and Body Aches:  RST: negative He declines flu test today 80 mg Depo IM today Continue Ibuprofen/Tylenol for fever Salt water gargles as needed for sore throat eRx for Augmentin 875-125 mg BID x 10 days to start on Sunday if worse eRx for Hycodan for cough  Return precautions discussed Webb Silversmith, NP

## 2018-01-30 NOTE — Patient Instructions (Signed)

## 2018-01-30 NOTE — Addendum Note (Signed)
Addended by: Lurlean Nanny on: 01/30/2018 11:30 AM   Modules accepted: Orders

## 2018-02-25 ENCOUNTER — Other Ambulatory Visit: Payer: Self-pay | Admitting: Primary Care

## 2018-02-25 DIAGNOSIS — F411 Generalized anxiety disorder: Secondary | ICD-10-CM

## 2018-02-28 ENCOUNTER — Ambulatory Visit: Payer: Managed Care, Other (non HMO) | Admitting: Family Medicine

## 2018-02-28 ENCOUNTER — Encounter: Payer: Self-pay | Admitting: Family Medicine

## 2018-02-28 DIAGNOSIS — L247 Irritant contact dermatitis due to plants, except food: Secondary | ICD-10-CM | POA: Diagnosis not present

## 2018-02-28 HISTORY — DX: Irritant contact dermatitis due to plants, except food: L24.7

## 2018-02-28 MED ORDER — METHYLPREDNISOLONE ACETATE 80 MG/ML IJ SUSP
80.0000 mg | Freq: Once | INTRAMUSCULAR | Status: AC
  Administered 2018-02-28: 80 mg via INTRAMUSCULAR

## 2018-02-28 NOTE — Progress Notes (Signed)
   Subjective:    Patient ID: Edward Day, male    DOB: 04/09/1986, 32 y.o.   MRN: [id number]  Rash  This is a new problem. The current episode started 1 to 4 weeks ago. The problem has been gradually worsening since onset. The affected locations include the right arm. The rash is characterized by itchiness, blistering and redness. He was exposed to plant contact. Pertinent negatives include no cough, facial edema, fatigue, fever or shortness of breath. Past treatments include anti-itch cream. The treatment provided no relief. There is no history of allergies, asthma, eczema or varicella.   Feels like throat tight.. Not sure if allergies or from reaction.  Blood pressure (!) 144/86, pulse 83, temperature 98 F (36.7 C), temperature source Oral, weight (!) 327 lb 4 oz (148.4 kg), SpO2 96 %.    Review of Systems  Constitutional: Negative for fatigue and fever.  Respiratory: Negative for cough and shortness of breath.   Skin: Positive for rash.       Objective:   Physical Exam  Constitutional: Vital signs are normal. He appears well-developed and well-nourished.  Morbidly obese male in NAD  HENT:  Head: Normocephalic.  Right Ear: Hearing normal.  Left Ear: Hearing normal.  Nose: Nose normal.  Mouth/Throat: Oropharynx is clear and moist and mucous membranes are normal.  Neck: Trachea normal. Carotid bruit is not present. No thyroid mass and no thyromegaly present.  Cardiovascular: Normal rate, regular rhythm and normal pulses. Exam reveals no gallop, no distant heart sounds and no friction rub.  No murmur heard. No peripheral edema  Pulmonary/Chest: Effort normal and breath sounds normal. No respiratory distress.  Skin: Skin is warm, dry and intact. No rash noted.  Right forearm linear erythematous rash, with blisters... Spreading up arm per pt  Psychiatric: He has a normal mood and affect. His speech is normal and behavior is normal. Thought content normal.            Assessment & Plan:

## 2018-02-28 NOTE — Assessment & Plan Note (Signed)
Given possible allergic reaction in throat and aorsening rash.. Treat with injected steroid. Higher dose given pt size.

## 2018-02-28 NOTE — Patient Instructions (Signed)
Call if not improving as expected.  Can use topical cortisone  10 cream twice daily.

## 2018-02-28 NOTE — Progress Notes (Signed)
r 

## 2018-02-28 NOTE — Addendum Note (Signed)
Addended by: Jiles Prows on: 02/28/2018 09:43 AM   Modules accepted: Orders

## 2018-03-06 ENCOUNTER — Encounter: Payer: Self-pay | Admitting: Emergency Medicine

## 2018-03-06 ENCOUNTER — Ambulatory Visit: Payer: Self-pay | Admitting: Emergency Medicine

## 2018-03-06 VITALS — BP 160/100 | HR 99 | Temp 98.5°F | Resp 14 | Ht 73.0 in | Wt 315.0 lb

## 2018-03-06 DIAGNOSIS — L03211 Cellulitis of face: Secondary | ICD-10-CM

## 2018-03-06 MED ORDER — MUPIROCIN CALCIUM 2 % EX CREA: 1.0000 "application " | TOPICAL_CREAM | Freq: Three times a day (TID) | CUTANEOUS | 0 refills | Status: DC

## 2018-03-06 MED ORDER — HYDROCODONE-ACETAMINOPHEN 5-325 MG PO TABS: 1.0000 | ORAL_TABLET | Freq: Four times a day (QID) | ORAL | 0 refills | Status: DC | PRN

## 2018-03-06 MED ORDER — SULFAMETHOXAZOLE-TRIMETHOPRIM 800-160 MG PO TABS: 2.0000 | ORAL_TABLET | Freq: Two times a day (BID) | ORAL | 0 refills | Status: DC

## 2018-03-06 NOTE — Progress Notes (Signed)
  Subjective:     Patient ID: Edward Day, male   DOB: [birth year], 32 y.o.   MRN: [id number]  HPI Onset yesterday, red, swollen, very painful red warm bump on face, inferior and at the opening left nostril.  No drainage.  No vesicles.  No history of cold sores.  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  Had I&D of abscess left neck about a month ago in ER, that resolved.  Was treated with doxycycline and then, but he states he got severe GI upset and he does not want doxycycline again. Denies fever or chills or nausea or vomiting. He states the pain is 9 out of possible 10 and requests a strong pain medicine for short-term.  Review of Systems  Constitutional: Negative for fever.  HENT: Negative.  Negative for congestion, dental problem, ear discharge, nosebleeds and sore throat.   Respiratory: Negative.   Cardiovascular: Negative.   Gastrointestinal: Negative.    Pertinent items noted in HPI and remainder of comprehensive ROS otherwise negative.     Objective:       Physical Exam  Constitutional: He is oriented to person, place, and time. He appears well-developed and well-nourished.  No cardiorespiratory distress, but he is uncomfortable from the soreness left face.  HENT: No nasal lesions or obstruction.  Ears and throat normal Head: Atraumatic.  Just inferior to left nostril, 5 x 5 mm area of redness, induration and severe tenderness.  No fluctuance or drainage.  The surrounding soft tissue 1 x 1 cm is mildly warm and red and swollen.  No red streaks.  Orbits within normal limits. Eyes: Pupils are equal, round, and reactive to light. No scleral icterus.  Neck: Normal range of motion. Neck supple. No adenopathy Cardiovascular: Normal rate and regular rhythm.  Pulmonary/Chest: Effort normal.  Abdominal: He exhibits no distension.  Neurological: He is alert and oriented to person, place, and time. No cranial nerve deficit.  Skin: Skin is warm and dry.  Psychiatric: He has a  normal mood and affect. His behavior is normal.  Vitals reviewed.-BP 160/100.  Pulse by me 92, temp 98.5, respiratory rate 12, O2 saturation 98% room air.    Assessment:     Facial cellulitis, specifically inferior left nostril.    Plan:     Treatment options discussed, as well as risks, benefits, alternatives. Patient voiced understanding and agreement with the following plans: New Prescriptions   HYDROCODONE-ACETAMINOPHEN (NORCO/VICODIN) 5-325 MG TABLET    Take 1 tablet by mouth every 6 (six) hours as needed for severe pain. Take with food.   MUPIROCIN CREAM (BACTROBAN) 2 %    Apply 1 application topically 3 (three) times daily. For 5 days   SULFAMETHOXAZOLE-TRIMETHOPRIM (BACTRIM DS,SEPTRA DS) 800-160 MG TABLET    Take 2 tablets by mouth 2 (two) times daily for 10 days.  Warm compresses and other symptomatic care discussed.  See instructions in AVS, printed and given to patient. Note written excusing from work today and tomorrow. Precautions discussed. Red flags discussed.-Emergency room if any red flags or if not improving in 3 days. Questions invited and answered. Patient voiced understanding and agreement.  I reviewed PMP aware website, and although he has had small amounts of PRN pain medicine prescribed, I feel the benefits of prescribing small amount of Vicodin, outweigh risks.  Discussed with patient and precautions discussed.  He voiced understanding.

## 2018-03-06 NOTE — Patient Instructions (Addendum)
You have a soft tissue infection on face, just inside left nostril.  Prescriptions already sent to your pharmacy: Septra antibiotic, 2 twice a day for 10 days Bactroban antibiotic topically 3 times a day Small amount of Vicodin, 1 every 6 hours if needed for severe pain.  If any severely worsening symptoms or if no better in 3 days, go to emergency room.   Cellulitis, Adult Cellulitis is a skin infection. The infected area is usually red and tender. This condition occurs most often in the arms and lower legs. The infection can travel to the muscles, blood, and underlying tissue and become serious. It is very important to get treated for this condition. What are the causes? Cellulitis is caused by bacteria. The bacteria enter through a break in the skin, such as a cut, burn, insect bite, open sore, or crack. What increases the risk? This condition is more likely to occur in people who:  Have a weak defense system (immune system).  Have open wounds on the skin such as cuts, burns, bites, and scrapes. Bacteria can enter the body through these open wounds.  Are older.  Have diabetes.  Have a type of long-lasting (chronic) liver disease (cirrhosis) or kidney disease.  Use IV drugs.  What are the signs or symptoms? Symptoms of this condition include:  Redness, streaking, or spotting on the skin.  Swollen area of the skin.  Tenderness or pain when an area of the skin is touched.  Warm skin.  Fever.  Chills.  Blisters.  How is this diagnosed? This condition is diagnosed based on a medical history and physical exam. You may also have tests, including:  Blood tests.  Lab tests.  Imaging tests.  How is this treated? Treatment for this condition may include:  Medicines, such as antibiotic medicines or antihistamines.  Supportive care, such as rest and application of cold or warm cloths (cold or warm compresses) to the skin.  Hospital care, if the condition is  severe.  The infection usually gets better within 1-2 days of treatment. Follow these instructions at home:  Take over-the-counter and prescription medicines only as told by your health care provider.  If you were prescribed an antibiotic medicine, take it as told by your health care provider. Do not stop taking the antibiotic even if you start to feel better.  Drink enough fluid to keep your urine clear or pale yellow.  Do not touch or rub the infected area.  Raise (elevate) the infected area above the level of your heart while you are sitting or lying down.  Apply warm or cold compresses to the affected area as told by your health care provider.  Keep all follow-up visits as told by your health care provider. This is important. These visits let your health care provider make sure a more serious infection is not developing. Contact a health care provider if:  You have a fever.  Your symptoms do not improve within 1-2 days of starting treatment.  Your bone or joint underneath the infected area becomes painful after the skin has healed.  Your infection returns in the same area or another area.  You notice a swollen bump in the infected area.  You develop new symptoms.  You have a general ill feeling (malaise) with muscle aches and pains. Get help right away if:  Your symptoms get worse.  You feel very sleepy.  You develop vomiting or diarrhea that persists.  You notice red streaks coming from the infected area.  Your red area gets larger or turns dark in color. This information is not intended to replace advice given to you by your health care provider. Make sure you discuss any questions you have with your health care provider.

## 2018-03-07 ENCOUNTER — Observation Stay
Admission: EM | Admit: 2018-03-07 | Discharge: 2018-03-08 | Disposition: A | Discharge status: Home / Self Care | Payer: Managed Care, Other (non HMO) | Attending: Family Medicine | Admitting: Family Medicine

## 2018-03-07 ENCOUNTER — Other Ambulatory Visit: Payer: Self-pay

## 2018-03-07 ENCOUNTER — Encounter: Payer: Self-pay | Admitting: *Deleted

## 2018-03-07 DIAGNOSIS — L03211 Cellulitis of face: Secondary | ICD-10-CM | POA: Diagnosis not present

## 2018-03-07 DIAGNOSIS — R6883 Chills (without fever): Secondary | ICD-10-CM | POA: Insufficient documentation

## 2018-03-07 DIAGNOSIS — Z79899 Other long term (current) drug therapy: Secondary | ICD-10-CM | POA: Insufficient documentation

## 2018-03-07 DIAGNOSIS — K219 Gastro-esophageal reflux disease without esophagitis: Secondary | ICD-10-CM | POA: Insufficient documentation

## 2018-03-07 DIAGNOSIS — F419 Anxiety disorder, unspecified: Secondary | ICD-10-CM | POA: Diagnosis not present

## 2018-03-07 DIAGNOSIS — I10 Essential (primary) hypertension: Secondary | ICD-10-CM | POA: Diagnosis not present

## 2018-03-07 DIAGNOSIS — Z86711 Personal history of pulmonary embolism: Secondary | ICD-10-CM | POA: Insufficient documentation

## 2018-03-07 DIAGNOSIS — Z86718 Personal history of other venous thrombosis and embolism: Secondary | ICD-10-CM | POA: Diagnosis not present

## 2018-03-07 DIAGNOSIS — F411 Generalized anxiety disorder: Secondary | ICD-10-CM | POA: Diagnosis not present

## 2018-03-07 DIAGNOSIS — D751 Secondary polycythemia: Secondary | ICD-10-CM | POA: Insufficient documentation

## 2018-03-07 DIAGNOSIS — Z87891 Personal history of nicotine dependence: Secondary | ICD-10-CM | POA: Diagnosis not present

## 2018-03-07 HISTORY — DX: Cellulitis of face: L03.211

## 2018-03-07 LAB — CBC WITH DIFFERENTIAL/PLATELET
ABS IMMATURE GRANULOCYTES: 0.05 10*3/uL (ref 0.00–0.07)
Basophils Absolute: 0 10*3/uL (ref 0.0–0.1)
Basophils Relative: 0 %
Eosinophils Absolute: 0.1 10*3/uL (ref 0.0–0.5)
Eosinophils Relative: 1 %
HCT: 47.1 % (ref 39.0–52.0)
HEMOGLOBIN: 15.5 g/dL (ref 13.0–17.0)
IMMATURE GRANULOCYTES: 1 %
LYMPHS PCT: 27 %
Lymphs Abs: 2.6 10*3/uL (ref 0.7–4.0)
MCH: 30.2 pg (ref 26.0–34.0)
MCHC: 32.9 g/dL (ref 30.0–36.0)
MCV: 91.6 fL (ref 80.0–100.0)
MONO ABS: 0.6 10*3/uL (ref 0.1–1.0)
MONOS PCT: 6 %
NEUTROS ABS: 6.2 10*3/uL (ref 1.7–7.7)
NEUTROS PCT: 65 %
NRBC: 0 % (ref 0.0–0.2)
PLATELETS: 272 10*3/uL (ref 150–400)
RBC: 5.14 MIL/uL (ref 4.22–5.81)
RDW: 13.5 % (ref 11.5–15.5)
WBC: 9.6 10*3/uL (ref 4.0–10.5)

## 2018-03-07 LAB — COMPREHENSIVE METABOLIC PANEL
ALBUMIN: 3.9 g/dL (ref 3.5–5.0)
ALK PHOS: 74 U/L (ref 38–126)
ALT: 43 U/L (ref 0–44)
ANION GAP: 7 (ref 5–15)
AST: 29 U/L (ref 15–41)
BILIRUBIN TOTAL: 0.3 mg/dL (ref 0.3–1.2)
BUN: 10 mg/dL (ref 6–20)
CALCIUM: 8.8 mg/dL — AB (ref 8.9–10.3)
CO2: 28 mmol/L (ref 22–32)
Chloride: 103 mmol/L (ref 98–111)
Creatinine, Ser: 0.78 mg/dL (ref 0.61–1.24)
GFR calc Af Amer: >60 mL/min (ref >60)
GLUCOSE: 97 mg/dL (ref 70–99)
POTASSIUM: 3.9 mmol/L (ref 3.5–5.1)
Sodium: 138 mmol/L (ref 135–145)
TOTAL PROTEIN: 7.7 g/dL (ref 6.5–8.1)

## 2018-03-07 MED ORDER — MUPIROCIN CALCIUM 2 % EX CREA
1.0000 "application " | TOPICAL_CREAM | Freq: Three times a day (TID) | CUTANEOUS | Status: DC
  Administered 2018-03-07 – 2018-03-08 (×2): 1 via TOPICAL
  Filled 2018-03-07: qty 15

## 2018-03-07 MED ORDER — DIPHENHYDRAMINE HCL 25 MG PO CAPS: 25.0000 mg | ORAL_CAPSULE | Freq: Four times a day (QID) | ORAL | Status: DC | PRN

## 2018-03-07 MED ORDER — SENNOSIDES-DOCUSATE SODIUM 8.6-50 MG PO TABS: 1.0000 | ORAL_TABLET | Freq: Every evening | ORAL | Status: DC | PRN

## 2018-03-07 MED ORDER — ACETAMINOPHEN 325 MG PO TABS: 650.0000 mg | ORAL_TABLET | Freq: Four times a day (QID) | ORAL | Status: DC | PRN

## 2018-03-07 MED ORDER — ENOXAPARIN SODIUM 40 MG/0.4ML ~~LOC~~ SOLN
40.0000 mg | Freq: Two times a day (BID) | SUBCUTANEOUS | Status: DC
  Filled 2018-03-07 (×2): qty 0.4

## 2018-03-07 MED ORDER — MORPHINE SULFATE (PF) 2 MG/ML IV SOLN
2.0000 mg | INTRAVENOUS | Status: DC | PRN
  Administered 2018-03-07 – 2018-03-08 (×2): 2 mg via INTRAVENOUS
  Filled 2018-03-07 (×2): qty 1

## 2018-03-07 MED ORDER — LOSARTAN POTASSIUM 50 MG PO TABS: 100.0000 mg | ORAL_TABLET | Freq: Every day | ORAL | Status: DC

## 2018-03-07 MED ORDER — VANCOMYCIN HCL IN DEXTROSE 1-5 GM/200ML-% IV SOLN
1000.0000 mg | Freq: Once | INTRAVENOUS | Status: AC
  Administered 2018-03-07: 1000 mg via INTRAVENOUS
  Filled 2018-03-07: qty 200

## 2018-03-07 MED ORDER — ONDANSETRON HCL 4 MG/2ML IJ SOLN: 4.0000 mg | Freq: Four times a day (QID) | INTRAMUSCULAR | Status: DC | PRN

## 2018-03-07 MED ORDER — CITALOPRAM HYDROBROMIDE 20 MG PO TABS
40.0000 mg | ORAL_TABLET | Freq: Every day | ORAL | Status: DC
  Administered 2018-03-08: 40 mg via ORAL
  Filled 2018-03-07: qty 2

## 2018-03-07 MED ORDER — MORPHINE SULFATE (PF) 2 MG/ML IV SOLN
2.0000 mg | Freq: Once | INTRAVENOUS | Status: AC
  Administered 2018-03-07: 2 mg via INTRAVENOUS
  Filled 2018-03-07: qty 1

## 2018-03-07 MED ORDER — SODIUM CHLORIDE 0.9 % IV SOLN
INTRAVENOUS | Status: DC
  Administered 2018-03-07: 18:00:00 via INTRAVENOUS

## 2018-03-07 MED ORDER — ACETAMINOPHEN 650 MG RE SUPP: 650.0000 mg | Freq: Four times a day (QID) | RECTAL | Status: DC | PRN

## 2018-03-07 MED ORDER — HYDROCODONE-ACETAMINOPHEN 5-325 MG PO TABS
1.0000 | ORAL_TABLET | Freq: Four times a day (QID) | ORAL | Status: DC | PRN
  Administered 2018-03-07 – 2018-03-08 (×3): 1 via ORAL
  Filled 2018-03-07 (×3): qty 1

## 2018-03-07 MED ORDER — SODIUM CHLORIDE 0.9 % IV SOLN
1.0000 g | Freq: Once | INTRAVENOUS | Status: AC
  Administered 2018-03-07: 1 g via INTRAVENOUS
  Filled 2018-03-07: qty 10

## 2018-03-07 MED ORDER — PANTOPRAZOLE SODIUM 40 MG PO TBEC
40.0000 mg | DELAYED_RELEASE_TABLET | Freq: Every day | ORAL | Status: DC
  Administered 2018-03-08: 40 mg via ORAL
  Filled 2018-03-07: qty 1

## 2018-03-07 MED ORDER — ONDANSETRON HCL 4 MG PO TABS: 4.0000 mg | ORAL_TABLET | Freq: Four times a day (QID) | ORAL | Status: DC | PRN

## 2018-03-07 MED ORDER — ONDANSETRON HCL 4 MG/2ML IJ SOLN
4.0000 mg | Freq: Once | INTRAMUSCULAR | Status: AC
  Administered 2018-03-07: 4 mg via INTRAVENOUS
  Filled 2018-03-07: qty 2

## 2018-03-07 MED ORDER — TRAZODONE HCL 50 MG PO TABS: 150.0000 mg | ORAL_TABLET | Freq: Every evening | ORAL | Status: DC | PRN

## 2018-03-07 MED ORDER — SODIUM CHLORIDE 0.9 % IV SOLN
2.0000 g | INTRAVENOUS | Status: DC
  Filled 2018-03-07: qty 20

## 2018-03-07 MED ORDER — METHYLPREDNISOLONE SODIUM SUCC 125 MG IJ SOLR
125.0000 mg | Freq: Once | INTRAMUSCULAR | Status: AC
  Administered 2018-03-07: 125 mg via INTRAVENOUS
  Filled 2018-03-07: qty 2

## 2018-03-07 NOTE — ED Provider Notes (Signed)
[facility] Emergency Department Provider Note   ____________________________________________   First MD Initiated Contact with Patient 03/07/18 1458     (approximate)  I have reviewed the triage vital signs and the nursing notes.   HISTORY  Chief Complaint Abscess    HPI Edward Day is a 32 y.o. male who was seen in the office yesterday diagnosed with facial cellulitis had a large pimple in the left nares.  Was started on Bactrim.  He has had 3 doses of it so far.  He is complaining of increasing pain and swelling and tenderness down to the lip and over into the left cheek.  His teeth are hurting on that side as well.  He had chills last night.  Pain is moderately severe.  Deep and achy.  Nothing makes it better pushing on it makes it worse.   Past Medical History:  Diagnosis Date  . DVT (deep venous thrombosis) ([place])   . GAD (generalized anxiety disorder)   . GERD (gastroesophageal reflux disease)   . H. pylori infection   . Hypertension   . Low testosterone   . Polycythemia   . Pulmonary embolism [facility])     Patient Active Problem List   Diagnosis Date Noted  . Plant irritant contact dermatitis 02/28/2018  . GAD (generalized anxiety disorder) 09/20/2017  . Gastroesophageal reflux disease 09/20/2017  . 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 Pain in right knee 04/22/2017  . Pulmonary embolism ([place]) 11/23/2016  . Chest pain 11/11/2013  . Hyperlipidemia 11/11/2013  . Essential hypertension 09/25/2006    Past Surgical History:  Procedure Laterality Date  . APPENDECTOMY  2003  . ELBOW SURGERY  2014  . FINGER SURGERY Right 2003  . KNEE SURGERY    . SHOULDER SURGERY  2009, 2013, 2013, 2014  . TONSILLECTOMY      Prior to Admission medications   Medication Sig Start Date End Date Taking? Authorizing Provider  citalopram (CELEXA) 40 MG tablet TAKE 1 TABLET BY MOUTH ONCE DAILY 02/26/18   Pleas Koch, NP  HYDROcodone-acetaminophen  (NORCO/VICODIN) 5-325 MG tablet Take 1 tablet by mouth every 6 (six) hours as needed for severe pain. Take with food. 03/06/18   Jacqulyn Cane, MD  losartan (COZAAR) 100 MG tablet Take 1 tablet by mouth once daily for blood pressure. 09/20/17   Pleas Koch, NP  mupirocin cream (BACTROBAN) 2 % Apply 1 application topically 3 (three) times daily. For 5 days 03/06/18   Jacqulyn Cane, MD  pantoprazole (PROTONIX) 40 MG tablet Take 1 tablet by mouth once daily for heartburn. 09/20/17   Pleas Koch, NP  sulfamethoxazole-trimethoprim (BACTRIM DS,SEPTRA DS) 800-160 MG tablet Take 2 tablets by mouth 2 (two) times daily for 10 days. 03/06/18 03/16/18  Jacqulyn Cane, MD  traZODone (DESYREL) 150 MG tablet TAKE 1 TABLET BY MOUTH AT BEDTIME FOR INSOMNIA 12/20/17   Pleas Koch, NP    Allergies Banana  Family History  Problem Relation Age of Onset  . Hypertension Mother   . Polycythemia Mother   . Hyperlipidemia Mother   . Diabetes Father   . Hypertension Father   . Cancer Maternal Grandmother        breast cancer  . Dementia Maternal Grandmother   . Diabetes Maternal Grandmother     Social History Social History   Tobacco Use  . Smoking status: Former Smoker    Packs/day: 0.50    Years: 15.00    Pack years: 7.50  Types: Cigarettes    Last attempt to quit: 05/24/2016    Years since quitting: 1.7  . Smokeless tobacco: Former Systems developer    Types: Snuff  . Tobacco comment: has cut back to one cig every 6 days 09/06/16  Substance Use Topics  . Alcohol use: Yes    Alcohol/week: 0.0 standard drinks  . Drug use: No    Review of Systems  Constitutional: See HPI Eyes: No visual changes. ENT: No sore throat. Cardiovascular: Denies chest pain. Respiratory: Denies shortness of breath. Gastrointestinal: No abdominal pain.  No nausea, no vomiting.  No diarrhea.  No constipation. Genitourinary: Negative for dysuria. Musculoskeletal: Negative for back pain. Skin: Negative for  rash. Neurological: Negative for headaches, focal   ____________________________________________   PHYSICAL EXAM:  VITAL SIGNS: ED Triage Vitals  Enc Vitals Group     BP 03/07/18 1432 (!) 145/98     Pulse Rate 03/07/18 1432 90     Resp 03/07/18 1432 16     Temp 03/07/18 1432 98.1 F (36.7 C)     Temp Source 03/07/18 1432 Oral     SpO2 03/07/18 1432 90 %     Weight 03/07/18 1428 (!) 315 lb (142.9 kg)     Height 03/07/18 1428 6\' 1"  (1.854 m)     Head Circumference --      Peak Flow --      Pain Score 03/07/18 1428 10     Pain Loc --      Pain Edu? --      Excl. in [place]? --    Constitutional: See HPI Head: Swelling and redness in the left cheek left upper lip and around the left nostril. Nose: No no congestion see head above. Mouth: Mucous membranes are moist.  Oropharynx non-erythematous. Neck: No stridor. Cardiovascular: Normal rate, regular rhythm. Grossly normal heart sounds.  Good peripheral circulation. Respiratory: Normal respiratory effort.  No retractions. Lungs CTAB. Gastrointestinal: Soft and nontender. No distention. No abdominal bruits. No CVA tenderness. Musculoskeletal: No lower extremity tenderness nor edema.  No joint effusions. Neurologic:  Normal speech and language. No gross focal neurologic deficits are appreciated. No gait instability. Skin:  Skin is warm, dry and intact. No rash noted. Psychiatric: Mood and affect are normal. Speech and behavior are normal.  ____________________________________________   LABS (all labs ordered are listed, but only abnormal results are displayed)  Labs Reviewed  COMPREHENSIVE METABOLIC PANEL - Abnormal; Notable for the following components:      Result Value   Calcium 8.8 (*)    All other components within normal limits  CBC WITH DIFFERENTIAL/PLATELET   ____________________________________________  EKG   ____________________________________________  RADIOLOGY  ED MD interpretation:   Official radiology  report(s): No results found.  ____________________________________________   PROCEDURES  Procedure(s) performed:   Procedures  Critical Care performed:   ____________________________________________   INITIAL IMPRESSION / ASSESSMENT AND PLAN / ED COURSE  Patient is failed outpatient management of his facial infection.  He has progressed significantly since he was seen.  We will get some blood work to check start some IV antibiotics expect he will probably have to come in the hospital.      ____________________________________________   FINAL CLINICAL IMPRESSION(S) / ED DIAGNOSES  Final diagnoses:  Facial cellulitis     ED Discharge Orders    None       Note:  This document was prepared using Dragon voice recognition software and may include unintentional dictation errors.    Nena Polio,  MD 03/07/18 1606

## 2018-03-07 NOTE — Progress Notes (Signed)
Advanced care plan.  Purpose of the Encounter: CODE STATUS  Parties in Attendance: Patient and family  Patient's Decision Capacity: Good  Subjective/Patient's story: Presented to the emergency room for a large pimple in the left nares and swelling of the upper lip   Objective/Medical story Has facial cellulitis and needs antibiotics IV   Goals of care determination:  Advance care directives goals of care and treatment plan discussed Patient wants everything done   CODE STATUS: Full code  Time spent discussing advanced care planning: 16 minutes

## 2018-03-07 NOTE — Progress Notes (Signed)
Pt admitted to the floor. Oriented to the unit, safety mechanisms, and call bell system. Pt in no acute distress, VSS. Left nare has slight swelling and internal redness. Will continue to monitor.

## 2018-03-07 NOTE — ED Triage Notes (Signed)
Pt to ED reporting a nasal abscess that pt was seen for yesterday and placed on PO antibiotics Pt reports the swelling has worsened and has spread to his neck and mouth. Left side of face is notable swollen. No SOB or swelling of pts airway. Chills last night but pt denies having fevers.

## 2018-03-07 NOTE — H&P (Signed)
[facility] [place] NAME: Edward Day    MR#:  [id number]  DATE OF BIRTH:  May 31, 1985  DATE OF ADMISSION:  03/07/2018  PRIMARY CARE PHYSICIAN: Pleas Koch, NP   REQUESTING/REFERRING PHYSICIAN:   CHIEF COMPLAINT:   Chief Complaint  Patient presents with  . Abscess    HISTORY OF PRESENT ILLNESS: Edward Day  is a 32 y.o. male with a known history of DVT in the past, GERD, H. pylori infection, hypertension, polycythemia presented to the emergency room with redness in the upper lip as well as swelling in the left nares.  Patient had a small pimple in the left nares which is squeezed and after that it started increasing the size with redness and tenderness.  Pain is present over the upper lip as well as the left cheek area.  No difficulty swallowing food.  No difficulty breathing .  Hospitalist service was consulted for further care. Patient was started on oral Bactrim as outpatient.  PAST MEDICAL HISTORY:   Past Medical History:  Diagnosis Date  . DVT (deep venous thrombosis) ([place])   . GAD (generalized anxiety disorder)   . GERD (gastroesophageal reflux disease)   . H. pylori infection   . Hypertension   . Low testosterone   . Polycythemia   . Pulmonary embolism ([place])     PAST SURGICAL HISTORY:  Past Surgical History:  Procedure Laterality Date  . APPENDECTOMY  2003  . ELBOW SURGERY  2014  . FINGER SURGERY Right 2003  . KNEE SURGERY    . SHOULDER SURGERY  2009, 2013, 2013, 2014  . TONSILLECTOMY      SOCIAL HISTORY:  Social History   Tobacco Use  . Smoking status: Former Smoker    Packs/day: 0.50    Years: 15.00    Pack years: 7.50    Types: Cigarettes    Last attempt to quit: 05/24/2016    Years since quitting: 1.7  . Smokeless tobacco: Former Systems developer    Types: Snuff  . Tobacco comment: has cut back to one cig every 6 days 09/06/16  Substance Use Topics  . Alcohol use: Yes    Alcohol/week: 0.0 standard  drinks    FAMILY HISTORY:  Family History  Problem Relation Age of Onset  . Hypertension Mother   . Polycythemia Mother   . Hyperlipidemia Mother   . Diabetes Father   . Hypertension Father   . Cancer Maternal Grandmother        breast cancer  . Dementia Maternal Grandmother   . Diabetes Maternal Grandmother     DRUG ALLERGIES:  Allergies  Allergen Reactions  . Banana Swelling and Anaphylaxis    REVIEW OF SYSTEMS:   CONSTITUTIONAL: No fever, fatigue or weakness.  EYES: No blurred or double vision.  EARS, NOSE, AND THROAT: No tinnitus or ear pain.  RESPIRATORY: No cough, shortness of breath, wheezing or hemoptysis.  CARDIOVASCULAR: No chest pain, orthopnea, edema.  GASTROINTESTINAL: No nausea, vomiting, diarrhea or abdominal pain.  GENITOURINARY: No dysuria, hematuria.  ENDOCRINE: No polyuria, nocturia,  HEMATOLOGY: No anemia, easy bruising or bleeding SKIN: Left nares : redness, swelling MUSCULOSKELETAL: No joint pain or arthritis.   NEUROLOGIC: No tingling, numbness, weakness.  PSYCHIATRY: No anxiety or depression.   MEDICATIONS AT HOME:  Prior to Admission medications   Medication Sig Start Date End Date Taking? Authorizing Provider  citalopram (CELEXA) 40 MG tablet TAKE 1 TABLET BY MOUTH ONCE DAILY Patient taking differently: Take  40 mg by mouth daily.  02/26/18  Yes Pleas Koch, NP  HYDROcodone-acetaminophen (NORCO/VICODIN) 5-325 MG tablet Take 1 tablet by mouth every 6 (six) hours as needed for severe pain. Take with food. 03/06/18  Yes Jacqulyn Cane, MD  losartan (COZAAR) 100 MG tablet Take 1 tablet by mouth once daily for blood pressure. 09/20/17  Yes Pleas Koch, NP  mupirocin cream (BACTROBAN) 2 % Apply 1 application topically 3 (three) times daily. For 5 days 03/06/18  Yes Jacqulyn Cane, MD  pantoprazole (PROTONIX) 40 MG tablet Take 1 tablet by mouth once daily for heartburn. 09/20/17  Yes Pleas Koch, NP  sulfamethoxazole-trimethoprim  (BACTRIM DS,SEPTRA DS) 800-160 MG tablet Take 2 tablets by mouth 2 (two) times daily for 10 days. 03/06/18 03/16/18 Yes Jacqulyn Cane, MD  traZODone (DESYREL) 150 MG tablet TAKE 1 TABLET BY MOUTH AT BEDTIME FOR INSOMNIA Patient taking differently: Take 150 mg by mouth at bedtime as needed for sleep. Take 1 tablet by mouth at bedtime for insomnia. 12/20/17  Yes Pleas Koch, NP      PHYSICAL EXAMINATION:   VITAL SIGNS: Blood pressure 130/85, pulse 80, temperature 98.1 F (36.7 C), temperature source Oral, resp. rate 16, height 6\' 1"  (1.854 m), weight (!) 142.9 kg, SpO2 95 %.  GENERAL:  32 y.o.-year-old patient lying in the bed with no acute distress.  EYES: Pupils equal, round, reactive to light and accommodation. No scleral icterus. Extraocular muscles intact.  HEENT: Head atraumatic, normocephalic. Oropharynx  Clear Left nares red and swollen NECK:  Supple, no jugular venous distention. No thyroid enlargement, no tenderness.  LUNGS: Normal breath sounds bilaterally, no wheezing, rales,rhonchi or crepitation. No use of accessory muscles of respiration.  CARDIOVASCULAR: S1, S2 normal. No murmurs, rubs, or gallops.  ABDOMEN: Soft, nontender, nondistended. Bowel sounds present. No organomegaly or mass.  EXTREMITIES: No pedal edema, cyanosis, or clubbing.  NEUROLOGIC: Cranial nerves II through XII are intact. Muscle strength 5/5 in all extremities. Sensation intact. Gait not checked.  PSYCHIATRIC: The patient is alert and oriented x 3.  SKIN: redness and tenderness over upper lip  LABORATORY PANEL:   CBC Recent Labs  Lab 03/07/18 1532  WBC 9.6  HGB 15.5  HCT 47.1  PLT 272  MCV 91.6  MCH 30.2  MCHC 32.9  RDW 13.5  LYMPHSABS 2.6  MONOABS 0.6  EOSABS 0.1  BASOSABS 0.0   ------------------------------------------------------------------------------------------------------------------  Chemistries  Recent Labs  Lab 03/07/18 1532  NA 138  K 3.9  CL 103  CO2 28  GLUCOSE  97  BUN 10  CREATININE 0.78  CALCIUM 8.8*  AST 29  ALT 43  ALKPHOS 74  BILITOT 0.3   ------------------------------------------------------------------------------------------------------------------ estimated creatinine clearance is 195.2 mL/min (by C-G formula based on SCr of 0.78 mg/dL). ------------------------------------------------------------------------------------------------------------------ No results for input(s): TSH, T4TOTAL, T3FREE, THYROIDAB in the last 72 hours.  Invalid input(s): FREET3   Coagulation profile No results for input(s): INR, PROTIME in the last 168 hours. ------------------------------------------------------------------------------------------------------------------- No results for input(s): DDIMER in the last 72 hours. -------------------------------------------------------------------------------------------------------------------  Cardiac Enzymes No results for input(s): CKMB, TROPONINI, MYOGLOBIN in the last 168 hours.  Invalid input(s): CK ------------------------------------------------------------------------------------------------------------------ Invalid input(s): POCBNP  ---------------------------------------------------------------------------------------------------------------  Urinalysis    Component Value Date/Time   COLORURINE Yellow 11/06/2013 1710   APPEARANCEUR Clear 11/06/2013 1710   LABSPEC 1.024 11/06/2013 1710   PHURINE 6.0 11/06/2013 1710   GLUCOSEU Negative 11/06/2013 1710   HGBUR Negative 11/06/2013 1710   BILIRUBINUR Negative 11/06/2013 1710   KETONESUR  Negative 11/06/2013 1710   PROTEINUR 100 mg/dL 11/06/2013 1710   NITRITE Negative 11/06/2013 1710   LEUKOCYTESUR Negative 11/06/2013 1710     RADIOLOGY: No results found.  EKG: Orders placed or performed during the hospital encounter of 11/23/16  . ED EKG  . ED EKG  . EKG 12-Lead  . EKG 12-Lead    IMPRESSION AND PLAN:  32 year old male  patient with history of DVT, pulmonary embolism, GERD, hypertension, polycythemia, anxiety disorder presented to the emergency room for redness and swelling in the left nares along with a upper lip  -Facial cellulitis Admit patient to medical floor IV Rocephin antibiotic Follow-up WBC count Follow-up cultures  -Hypertension Continue oral losartan  -DVT prophylaxis subcu Lovenox daily  -Generalized anxiety disorder Continue Celexa and trazodone  All the records are reviewed and case discussed with ED provider. Management plans discussed with the patient, family and they are in agreement.  CODE STATUS:Full code Code Status History    Date Active Date Inactive Code Status Order ID Comments User Context   11/23/2016 1806 11/24/2016 1820 Full Code [id number]  Elgergawy, Silver Huguenin, MD Inpatient       TOTAL TIME TAKING CARE OF THIS PATIENT: 52 minutes.    Saundra Shelling M.D on 03/07/2018 at 4:31 PM  Between 7am to 6pm - Pager - [phone]  After 6pm go to [email or web address] - password [email or web address]  [facility]  Office  [phone]  CC: Primary care physician; Pleas Koch, NP

## 2018-03-07 NOTE — Progress Notes (Signed)
Adjusted Enoxaparin from 40 mg SQ q24h to q12h as patient BMI > 40.  Paticia Stack, PharmD Pharmacy Resident  03/07/2018 5:44 PM

## 2018-03-08 LAB — BASIC METABOLIC PANEL WITH GFR
Anion gap: 14 (ref 5–15)
BUN: 13 mg/dL (ref 6–20)
CO2: 26 mmol/L (ref 22–32)
Calcium: 9 mg/dL (ref 8.9–10.3)
Chloride: 101 mmol/L (ref 98–111)
Creatinine, Ser: 0.75 mg/dL (ref 0.61–1.24)
GFR calc Af Amer: >60 mL/min
GFR calc non Af Amer: >60 mL/min
Glucose, Bld: 161 mg/dL — ABNORMAL HIGH (ref 70–99)
Potassium: 4.5 mmol/L (ref 3.5–5.1)
Sodium: 141 mmol/L (ref 135–145)

## 2018-03-08 LAB — CBC
HCT: 46.4 % (ref 39.0–52.0)
Hemoglobin: 15.3 g/dL (ref 13.0–17.0)
MCH: 30.5 pg (ref 26.0–34.0)
MCHC: 33 g/dL (ref 30.0–36.0)
MCV: 92.4 fL (ref 80.0–100.0)
NRBC: 0 % (ref 0.0–0.2)
PLATELETS: 290 10*3/uL (ref 150–400)
RBC: 5.02 MIL/uL (ref 4.22–5.81)
RDW: 13.5 % (ref 11.5–15.5)
WBC: 7.7 10*3/uL (ref 4.0–10.5)

## 2018-03-08 MED ORDER — CEFDINIR 300 MG PO CAPS: 300.0000 mg | ORAL_CAPSULE | Freq: Two times a day (BID) | ORAL | 0 refills | Status: DC

## 2018-03-08 NOTE — Discharge Summary (Signed)
[facility] [place] NAME: Edward Day    MR#:  [id number]  DATE OF BIRTH:  13-Jun-1985  DATE OF ADMISSION:  03/07/2018 ADMITTING PHYSICIAN: Saundra Shelling, MD  DATE OF DISCHARGE: No discharge date for patient encounter.  PRIMARY CARE PHYSICIAN: Pleas Koch, NP    ADMISSION DIAGNOSIS:  Facial cellulitis [F02.774]  DISCHARGE DIAGNOSIS:  Active Problems:   Facial cellulitis   SECONDARY DIAGNOSIS:   Past Medical History:  Diagnosis Date  . DVT (deep venous thrombosis) ([place])   . GAD (generalized anxiety disorder)   . GERD (gastroesophageal reflux disease)   . H. pylori infection   . Hypertension   . Low testosterone   . Polycythemia   . Pulmonary embolism [facility])     HOSPITAL COURSE:   32 year old male patient with history of DVT, pulmonary embolism, GERD, hypertension, polycythemia, anxiety disorder presented to the emergency room for redness and swelling in the left nares along with a upper lip  -Facial cellulitis Treated on our cellulitis protocol with empiric Rocephin, and patient did well  -Hypertension Continued oral losartan  DISCHARGE CONDITIONS:   stable  CONSULTS OBTAINED:    DRUG ALLERGIES:   Allergies  Allergen Reactions  . Banana Swelling and Anaphylaxis    DISCHARGE MEDICATIONS:   Allergies as of 03/08/2018      Reactions   Banana Swelling, Anaphylaxis      Medication List    STOP taking these medications   sulfamethoxazole-trimethoprim 800-160 MG tablet Commonly known as:  BACTRIM DS,SEPTRA DS     TAKE these medications   cefdinir 300 MG capsule Commonly known as:  OMNICEF Take 1 capsule (300 mg total) by mouth 2 (two) times daily.   citalopram 40 MG tablet Commonly known as:  CELEXA TAKE 1 TABLET BY MOUTH ONCE DAILY   HYDROcodone-acetaminophen 5-325 MG tablet Commonly known as:  NORCO/VICODIN Take 1 tablet by mouth every 6 (six) hours as needed for severe pain.  Take with food.   losartan 100 MG tablet Commonly known as:  COZAAR Take 1 tablet by mouth once daily for blood pressure.   mupirocin cream 2 % Commonly known as:  BACTROBAN Apply 1 application topically 3 (three) times daily. For 5 days   pantoprazole 40 MG tablet Commonly known as:  PROTONIX Take 1 tablet by mouth once daily for heartburn.   traZODone 150 MG tablet Commonly known as:  DESYREL TAKE 1 TABLET BY MOUTH AT BEDTIME FOR INSOMNIA What changed:  See the new instructions.        DISCHARGE INSTRUCTIONS:    If you experience worsening of your admission symptoms, develop shortness of breath, life threatening emergency, suicidal or homicidal thoughts you must seek medical attention immediately by calling 911 or calling your MD immediately  if symptoms less severe.  You Must read complete instructions/literature along with all the possible adverse reactions/side effects for all the Medicines you take and that have been prescribed to you. Take any new Medicines after you have completely understood and accept all the possible adverse reactions/side effects.   Please note  You were cared for by a hospitalist during your hospital stay. If you have any questions about your discharge medications or the care you received while you were in the hospital after you are discharged, you can call the unit and asked to speak with the hospitalist on call if the hospitalist that took care of you is not available. Once you are discharged, your primary  care physician will handle any further medical issues. Please note that NO REFILLS for any discharge medications will be authorized once you are discharged, as it is imperative that you return to your primary care physician (or establish a relationship with a primary care physician if you do not have one) for your aftercare needs so that they can reassess your need for medications and monitor your lab values.    Today   CHIEF COMPLAINT:    Chief Complaint  Patient presents with  . Abscess    HISTORY OF PRESENT ILLNESS:  32 y.o. male with a known history of DVT in the past, GERD, H. pylori infection, hypertension, polycythemia presented to the emergency room with redness in the upper lip as well as swelling in the left nares.  Patient had a small pimple in the left nares which is squeezed and after that it started increasing the size with redness and tenderness.  Pain is present over the upper lip as well as the left cheek area.  No difficulty swallowing food.  No difficulty breathing .  Hospitalist service was consulted for further care. Patient was started on oral Bactrim as outpatient.  VITAL SIGNS:  Blood pressure (!) 144/96, pulse 81, temperature 97.7 F (36.5 C), temperature source Oral, resp. rate 20, height 6\' 1"  (1.854 m), weight (!) 142.9 kg, SpO2 96 %.  I/O:    Intake/Output Summary (Last 24 hours) at 03/08/2018 1000 Last data filed at 03/08/2018 0913 Gross per 24 hour  Intake 1108.93 ml  Output -  Net 1108.93 ml    PHYSICAL EXAMINATION:  GENERAL:  32 y.o.-year-old patient lying in the bed with no acute distress.  EYES: Pupils equal, round, reactive to light and accommodation. No scleral icterus. Extraocular muscles intact.  HEENT: Head atraumatic, normocephalic. Oropharynx and nasopharynx clear.  NECK:  Supple, no jugular venous distention. No thyroid enlargement, no tenderness.  LUNGS: Normal breath sounds bilaterally, no wheezing, rales,rhonchi or crepitation. No use of accessory muscles of respiration.  CARDIOVASCULAR: S1, S2 normal. No murmurs, rubs, or gallops.  ABDOMEN: Soft, non-tender, non-distended. Bowel sounds present. No organomegaly or mass.  EXTREMITIES: No pedal edema, cyanosis, or clubbing.  NEUROLOGIC: Cranial nerves II through XII are intact. Muscle strength 5/5 in all extremities. Sensation intact. Gait not checked.  PSYCHIATRIC: The patient is alert and oriented x 3.  SKIN: No  obvious rash, lesion, or ulcer.   DATA REVIEW:   CBC Recent Labs  Lab 03/08/18 0459  WBC 7.7  HGB 15.3  HCT 46.4  PLT 290    Chemistries  Recent Labs  Lab 03/07/18 1532 03/08/18 0459  NA 138 141  K 3.9 4.5  CL 103 101  CO2 28 26  GLUCOSE 97 161*  BUN 10 13  CREATININE 0.78 0.75  CALCIUM 8.8* 9.0  AST 29  --   ALT 43  --   ALKPHOS 74  --   BILITOT 0.3  --     Cardiac Enzymes No results for input(s): TROPONINI in the last 168 hours.  Microbiology Results  No results found for this or any previous visit.  RADIOLOGY:  No results found.  EKG:   Orders placed or performed during the hospital encounter of 11/23/16  . ED EKG  . ED EKG  . EKG 12-Lead  . EKG 12-Lead      Management plans discussed with the patient, family and they are in agreement.  CODE STATUS:     Code Status Orders  (From admission, onward)  Start     Ordered   03/07/18 1737  Full code  Continuous     03/07/18 1736        Code Status History    Date Active Date Inactive Code Status Order ID Comments User Context   11/23/2016 1806 11/24/2016 1820 Full Code [id number]  Elgergawy, Silver Huguenin, MD Inpatient      TOTAL TIME TAKING CARE OF THIS PATIENT: 40 minutes.    Avel Peace Turrell Severt M.D on 03/08/2018 at 10:00 AM  Between 7am to 6pm - Pager - [phone]  After 6pm go to [email or web address] - password [email or web address] [facility]  Office  [phone]  CC: Primary care physician; Pleas Koch, NP   Note: This dictation was prepared with Dragon dictation along with smaller phrase technology. Any transcriptional errors that result from this process are unintentional.

## 2018-03-08 NOTE — Progress Notes (Signed)
Patient d/c home/ Instructions and prescription given to patient. Work note printed and given to patient. Wife coming to transport patient home. Patient verbalized understanding.

## 2018-03-10 ENCOUNTER — Telehealth: Payer: Self-pay

## 2018-03-10 LAB — HIV ANTIBODY (ROUTINE TESTING W REFLEX): HIV Screen 4th Generation wRfx: NONREACTIVE

## 2018-03-10 NOTE — Telephone Encounter (Signed)
Noted  

## 2018-03-10 NOTE — Telephone Encounter (Signed)
Left message for patient to call back and do TCM call and schedule an appointment.

## 2018-03-10 NOTE — Telephone Encounter (Signed)
Transition Care Management Follow-up Telephone Call   Date discharged? 03/08/18    How have you been since you were released from the hospital? Patient states he is doing better. No concerns   Do you understand why you were in the hospital? Yes        Do you understand the discharge instructions? Yes   Where were you discharged to? Home   Items Reviewed:  Medications reviewed: Yes-no questions about this.  Allergies reviewed: Yes  Dietary changes reviewed: yes-no changes were made.  Referrals reviewed: Yes-none to be made.   Functional Questionnaire:   Activities of Daily Living (ADLs):   He states they are independent in the following: ambulation, feeding, grooming, dressing, hygiene, toileting, bathing, continence. States they require assistance with the following: none.        Any transportation issues/concerns?: No.   Any patient concerns? No   Confirmed importance and date/time of follow-up visits scheduled Yes.  Provider Appointment booked with Alma Friendly, NP on 03/19/18.  Confirmed with patient if condition begins to worsen call PCP or go to the ER.  Patient was given the office number and encouraged to call back with question or concerns.  : Yes.

## 2018-03-19 ENCOUNTER — Inpatient Hospital Stay: Payer: Managed Care, Other (non HMO) | Admitting: Primary Care

## 2018-04-28 ENCOUNTER — Other Ambulatory Visit: Payer: Self-pay | Admitting: Primary Care

## 2018-04-28 DIAGNOSIS — F411 Generalized anxiety disorder: Secondary | ICD-10-CM

## 2018-04-28 DIAGNOSIS — G4726 Circadian rhythm sleep disorder, shift work type: Secondary | ICD-10-CM

## 2018-05-06 ENCOUNTER — Ambulatory Visit: Payer: Managed Care, Other (non HMO) | Admitting: Family Medicine

## 2018-05-06 ENCOUNTER — Encounter: Payer: Self-pay | Admitting: Family Medicine

## 2018-05-06 VITALS — BP 136/86 | HR 110 | Temp 99.6°F | Ht 73.0 in | Wt 323.5 lb

## 2018-05-06 DIAGNOSIS — R69 Illness, unspecified: Secondary | ICD-10-CM

## 2018-05-06 DIAGNOSIS — R52 Pain, unspecified: Secondary | ICD-10-CM | POA: Diagnosis not present

## 2018-05-06 DIAGNOSIS — J111 Influenza due to unidentified influenza virus with other respiratory manifestations: Secondary | ICD-10-CM

## 2018-05-06 DIAGNOSIS — R6883 Chills (without fever): Secondary | ICD-10-CM | POA: Diagnosis not present

## 2018-05-06 LAB — POC INFLUENZA A&B (BINAX/QUICKVUE)
Influenza A, POC: NEGATIVE
Influenza B, POC: NEGATIVE

## 2018-05-06 MED ORDER — GUAIFENESIN-CODEINE 100-10 MG/5ML PO SYRP: 5.0000 mL | ORAL_SOLUTION | Freq: Two times a day (BID) | ORAL | 0 refills | Status: DC | PRN

## 2018-05-06 MED ORDER — BENZONATATE 100 MG PO CAPS: 100.0000 mg | ORAL_CAPSULE | Freq: Three times a day (TID) | ORAL | 0 refills | Status: DC | PRN

## 2018-05-06 NOTE — Assessment & Plan Note (Addendum)
Flu swab negative however has typical influenza symptoms. Anticipate flu or flu-like illness. Discussed tamiflu, opted to avoid for now. Supportive care as per instructions. Update if not improving with treatment. Red flags to notify us reviewed.  Out of work note provided today.

## 2018-05-06 NOTE — Progress Notes (Signed)
BP 136/86 (BP Location: Left Arm, Patient Position: Sitting, Cuff Size: Large)   Pulse (!) 110   Temp 99.6 F (37.6 C) (Oral)   Ht 6\' 1"  (1.854 m)   Wt (!) 323 lb 8 oz (146.7 kg)   SpO2 97%   BMI 42.68 kg/m    CC: body aches Subjective:    Patient ID: Edward Day, male    DOB: [birth year], 33 y.o.   MRN: [id number]  HPI: Edward Day is a 33 y.o. male presenting on 05/06/2018 for Generalized Body Aches (C/o body aches, HA, bialteral ear pain and chills. Sxs started yesterday. Tried Nyquil and Tylenol Cold/Sinus. )   1d h/o sudden onset fevers/cihlls, body aches, fatigue, non productive cough affecting sleep. HA. R ear pain yesterday. ST, PNdrainage. Sinus pain. Dyspnea and wheezing.   No tooth pain.   Tried so far tylenol sinus, nyquil.   33 yo at home.   No sick contacts at home. No smokers at home.  No h/o asthma.      Relevant past medical, surgical, family and social history reviewed and updated as indicated. Interim medical history since our last visit reviewed. Allergies and medications reviewed and updated. Outpatient Medications Prior to Visit  Medication Sig Dispense Refill  . citalopram (CELEXA) 40 MG tablet TAKE 1 TABLET BY MOUTH ONCE A DAY 90 tablet 0  . losartan (COZAAR) 100 MG tablet Take 1 tablet by mouth once daily for blood pressure. 90 tablet 3  . pantoprazole (PROTONIX) 40 MG tablet Take 1 tablet by mouth once daily for heartburn. 90 tablet 3  . traZODone (DESYREL) 150 MG tablet TAKE 1 TABLET BY MOUTH EVERY NIGHT AT BEDTIME AS NEEDED FOR INSOMNIA. 90 tablet 0  . cefdinir (OMNICEF) 300 MG capsule Take 1 capsule (300 mg total) by mouth 2 (two) times daily. 10 capsule 0  . HYDROcodone-acetaminophen (NORCO/VICODIN) 5-325 MG tablet Take 1 tablet by mouth every 6 (six) hours as needed for severe pain. Take with food. 6 tablet 0  . mupirocin cream (BACTROBAN) 2 % Apply 1 application topically 3 (three) times daily. For 5 days 15 g 0   No  facility-administered medications prior to visit.      Per HPI unless specifically indicated in ROS section below Review of Systems Objective:    BP 136/86 (BP Location: Left Arm, Patient Position: Sitting, Cuff Size: Large)   Pulse (!) 110   Temp 99.6 F (37.6 C) (Oral)   Ht 6\' 1"  (1.854 m)   Wt (!) 323 lb 8 oz (146.7 kg)   SpO2 97%   BMI 42.68 kg/m   Wt Readings from Last 3 Encounters:  05/06/18 (!) 323 lb 8 oz (146.7 kg)  03/07/18 (!) 315 lb (142.9 kg)  03/06/18 (!) 315 lb (142.9 kg)    Physical Exam Vitals signs and nursing note reviewed.  Constitutional:      General: He is not in acute distress.    Appearance: Normal appearance. He is well-developed. He is obese.     Comments: Tired appearing  HENT:     Head: Normocephalic and atraumatic.     Right Ear: Hearing, tympanic membrane, ear canal and external ear normal.     Left Ear: Hearing, tympanic membrane, ear canal and external ear normal.     Nose: Mucosal edema and congestion present. No rhinorrhea.     Right Sinus: Maxillary sinus tenderness and frontal sinus tenderness present.     Left Sinus: Maxillary sinus tenderness and frontal  sinus tenderness present.     Mouth/Throat:     Mouth: Mucous membranes are moist.     Pharynx: Oropharynx is clear. Uvula midline. No oropharyngeal exudate or posterior oropharyngeal erythema.     Tonsils: No tonsillar abscesses.  Eyes:     General: No scleral icterus.    Conjunctiva/sclera: Conjunctivae normal.     Pupils: Pupils are equal, round, and reactive to light.  Neck:     Musculoskeletal: Normal range of motion and neck supple.  Cardiovascular:     Rate and Rhythm: Regular rhythm. Tachycardia present.     Pulses: Normal pulses.     Heart sounds: Normal heart sounds. No murmur.  Pulmonary:     Effort: Pulmonary effort is normal. No respiratory distress.     Breath sounds: No decreased air movement. Wheezing (faint exp wheezing) and rhonchi (mild L sided) present. No  decreased breath sounds or rales.  Lymphadenopathy:     Cervical: No cervical adenopathy.  Skin:    General: Skin is warm and dry.     Findings: No rash.  Neurological:     Mental Status: He is alert.       Results for orders placed or performed in visit on 05/06/18  POC Influenza A&B(BINAX/QUICKVUE)  Result Value Ref Range   Influenza A, POC Negative Negative   Influenza B, POC Negative Negative   Assessment & Plan:   Problem List Items Addressed This Visit    Influenza-like illness - Primary    Flu swab negative however has typical influenza symptoms. Anticipate flu or flu-like illness. Discussed tamiflu, opted to avoid for now. Supportive care as per instructions. Update if not improving with treatment. Red flags to notify us reviewed.  Out of work note provided today.        Other Visit Diagnoses    Chills       Relevant Orders   POC Influenza A&B(BINAX/QUICKVUE) (Completed)   Body aches       Relevant Orders   POC Influenza A&B(BINAX/QUICKVUE) (Completed)       Meds ordered this encounter  Medications  . benzonatate (TESSALON) 100 MG capsule    Sig: Take 1 capsule (100 mg total) by mouth 3 (three) times daily as needed for cough.    Dispense:  30 capsule    Refill:  0  . guaiFENesin-codeine (CHERATUSSIN AC) 100-10 MG/5ML syrup    Sig: Take 5 mLs by mouth 2 (two) times daily as needed for cough (sedation precautions).    Dispense:  118 mL    Refill:  0   Orders Placed This Encounter  Procedures  . POC Influenza A&B(BINAX/QUICKVUE)   Patient instructions: You have flu like illness.  Push fluids and rest.  Take ibuprofen 600mg  with meals for next 3-5 days.  Take codeine cough syrup for night time.  Tessalon perls during the day. Watch for fever >101 past 5 days, worsening productive cough, or worsening sinus and facial pressure past 7-10 days.   Follow up plan: Return if symptoms worsen or fail to improve.  Ria Bush, MD

## 2018-05-06 NOTE — Patient Instructions (Addendum)
You have flu like illness.  Push fluids and rest.  Take ibuprofen 600mg  with meals for next 3-5 days.  Take codeine cough syrup for night time.  Tessalon perls during the day. Watch for fever >101 past 5 days, worsening productive cough, or worsening sinus and facial pressure past 7-10 days.    Influenza, Adult Influenza, more commonly known as "the flu," is a viral infection that mainly affects the respiratory tract. The respiratory tract includes organs that help you breathe, such as the lungs, nose, and throat. The flu causes many symptoms similar to the common cold along with high fever and body aches. The flu spreads easily from person to person (is contagious). Getting a flu shot (influenza vaccination) every year is the best way to prevent the flu. What are the causes? This condition is caused by the influenza virus. You can get the virus by:  Breathing in droplets that are in the air from an infected person's cough or sneeze.  Touching something that has been exposed to the virus (has been contaminated) and then touching your mouth, nose, or eyes. What increases the risk? The following factors may make you more likely to get the flu:  Not washing or sanitizing your hands often.  Having close contact with many people during cold and flu season.  Touching your mouth, eyes, or nose without first washing or sanitizing your hands.  Not getting a yearly (annual) flu shot. You may have a higher risk for the flu, including serious problems such as a lung infection (pneumonia), if you:  Are older than 65.  Are pregnant.  Have a weakened disease-fighting system (immune system). You may have a weakened immune system if you: ? Have HIV or AIDS. ? Are undergoing chemotherapy. ? Are taking medicines that reduce (suppress) the activity of your immune system.  Have a long-term (chronic) illness, such as heart disease, kidney disease, diabetes, or lung disease.  Have a liver  disorder.  Are severely overweight (morbidly obese).  Have anemia. This is a condition that affects your red blood cells.  Have asthma. What are the signs or symptoms? Symptoms of this condition usually begin suddenly and last 4-14 days. They may include:  Fever and chills.  Headaches, body aches, or muscle aches.  Sore throat.  Cough.  Runny or stuffy (congested) nose.  Chest discomfort.  Poor appetite.  Weakness or fatigue.  Dizziness.  Nausea or vomiting. How is this diagnosed? This condition may be diagnosed based on:  Your symptoms and medical history.  A physical exam.  Swabbing your nose or throat and testing the fluid for the influenza virus. How is this treated? If the flu is diagnosed early, you can be treated with medicine that can help reduce how severe the illness is and how long it lasts (antiviral medicine). This may be given by mouth (orally) or through an IV. Taking care of yourself at home can help relieve symptoms. Your health care provider may recommend:  Taking over-the-counter medicines.  Drinking plenty of fluids. In many cases, the flu goes away on its own. If you have severe symptoms or complications, you may be treated in a hospital. Follow these instructions at home: Activity  Rest as needed and get plenty of sleep.  Stay home from work or school as told by your health care provider. Unless you are visiting your health care provider, avoid leaving home until your fever has been gone for 24 hours without taking medicine. Eating and drinking  Take  an oral rehydration solution (ORS). This is a drink that is sold at pharmacies and retail stores.  Drink enough fluid to keep your urine pale yellow.  Drink clear fluids in small amounts as you are able. Clear fluids include water, ice chips, diluted fruit juice, and low-calorie sports drinks.  Eat bland, easy-to-digest foods in small amounts as you are able. These foods include bananas,  applesauce, rice, lean meats, toast, and crackers.  Avoid drinking fluids that contain a lot of sugar or caffeine, such as energy drinks, regular sports drinks, and soda.  Avoid alcohol.  Avoid spicy or fatty foods. General instructions      Take over-the-counter and prescription medicines only as told by your health care provider.  Use a cool mist humidifier to add humidity to the air in your home. This can make it easier to breathe.  Cover your mouth and nose when you cough or sneeze.  Wash your hands with soap and water often, especially after you cough or sneeze. If soap and water are not available, use alcohol-based hand sanitizer.  Keep all follow-up visits as told by your health care provider. This is important. How is this prevented?   Get an annual flu shot. You may get the flu shot in late summer, fall, or winter. Ask your health care provider when you should get your flu shot.  Avoid contact with people who are sick during cold and flu season. This is generally fall and winter. Contact a health care provider if:  You develop new symptoms.  You have: ? Chest pain. ? Diarrhea. ? A fever.  Your cough gets worse.  You produce more mucus.  You feel nauseous or you vomit. Get help right away if:  You develop shortness of breath or difficulty breathing.  Your skin or nails turn a bluish color.  You have severe pain or stiffness in your neck.  You develop a sudden headache or sudden pain in your face or ear.  You cannot eat or drink without vomiting. Summary  Influenza, more commonly known as "the flu," is a viral infection that primarily affects your respiratory tract.  Symptoms of the flu usually begin suddenly and last 4-14 days.  Getting an annual flu shot is the best way to prevent getting the flu.  Stay home from work or school as told by your health care provider. Unless you are visiting your health care provider, avoid leaving home until your  fever has been gone for 24 hours without taking medicine.  Keep all follow-up visits as told by your health care provider. This is important. This information is not intended to replace advice given to you by your health care provider. Make sure you discuss any questions you have with your health care provider. Document Released: 03/30/2000 Document Revised: 09/18/2017 Document Reviewed: 09/18/2017 Elsevier Interactive Patient Education  2019 [employer].

## 2018-05-13 ENCOUNTER — Telehealth: Payer: Self-pay

## 2018-05-13 MED ORDER — AZITHROMYCIN 250 MG PO TABS: ORAL_TABLET | ORAL | 0 refills | Status: DC

## 2018-05-13 NOTE — Telephone Encounter (Signed)
Pt was seen by Dr Darnell Level 05/06/18; cough is worsening. Now prod cough with dark green phlegm, when blows nose has green mucus.pt is wheezing all the time; pt had fever last night but took tylenol which helped. No ST or earache but ears have pressure and popping. Pt request abx.[place].

## 2018-05-13 NOTE — Telephone Encounter (Signed)
plz notify I've sent this in for him. Schedule f/u appt if not better with abx

## 2018-05-14 MED ORDER — ALBUTEROL SULFATE HFA 108 (90 BASE) MCG/ACT IN AERS: 2.0000 | INHALATION_SPRAY | Freq: Four times a day (QID) | RESPIRATORY_TRACT | 0 refills | Status: DC | PRN

## 2018-05-14 NOTE — Telephone Encounter (Signed)
Spoke with pt relaying Dr. Synthia Innocent message. Pt verbalizes understanding but also requests rx for an inhaler for the wheezing.

## 2018-05-14 NOTE — Telephone Encounter (Signed)
Spoke with pt relaying Dr. G's message.  Pt verbalizes understanding and expresses his thanks.  

## 2018-05-14 NOTE — Telephone Encounter (Signed)
Patient notified as instructed by telephone. Patient stated that he only has 7 of the Tessalon pills left. Patient stated that he is coughing, has some SOB and wheezing. Patient requested that an inhaler be sent in for him to help with these symptoms. [place]

## 2018-05-14 NOTE — Telephone Encounter (Signed)
Agree.  Patient will need to follow-up if no improvement.

## 2018-05-14 NOTE — Addendum Note (Signed)
Addended by: Ria Bush on: 05/14/2018 11:49 AM   Modules accepted: Orders

## 2018-05-14 NOTE — Telephone Encounter (Signed)
Does he have h/o asthma?  Usually don't need inhaler if no asthma. May take albuterol inhaler 1-2 puffs TID PRN dyspnea, cough, wheezing (may make him jittery/make heart race) but definitely f/u in office if no better with this.

## 2018-05-30 ENCOUNTER — Other Ambulatory Visit: Payer: Self-pay | Admitting: Primary Care

## 2018-05-30 DIAGNOSIS — Z20828 Contact with and (suspected) exposure to other viral communicable diseases: Secondary | ICD-10-CM

## 2018-05-30 MED ORDER — OSELTAMIVIR PHOSPHATE 75 MG PO CAPS: 75.0000 mg | ORAL_CAPSULE | Freq: Every day | ORAL | 0 refills | Status: AC

## 2018-06-24 ENCOUNTER — Other Ambulatory Visit: Payer: Self-pay | Admitting: Physician Assistant

## 2018-06-24 DIAGNOSIS — M25561 Pain in right knee: Secondary | ICD-10-CM

## 2018-06-29 ENCOUNTER — Ambulatory Visit
Admission: RE | Admit: 2018-06-29 | Discharge: 2018-06-29 | Disposition: A | Discharge status: Home / Self Care | Payer: Worker's Compensation | Source: Ambulatory Visit | Attending: Physician Assistant | Admitting: Physician Assistant

## 2018-06-29 ENCOUNTER — Other Ambulatory Visit: Payer: Self-pay

## 2018-06-29 DIAGNOSIS — M25561 Pain in right knee: Secondary | ICD-10-CM

## 2018-07-11 ENCOUNTER — Telehealth: Payer: Managed Care, Other (non HMO) | Admitting: Nurse Practitioner

## 2018-07-11 DIAGNOSIS — L03211 Cellulitis of face: Secondary | ICD-10-CM

## 2018-07-11 MED ORDER — CEPHALEXIN 500 MG PO CAPS: 500.0000 mg | ORAL_CAPSULE | Freq: Three times a day (TID) | ORAL | 0 refills | Status: DC

## 2018-07-11 NOTE — Progress Notes (Signed)
E Visit for Rash  We are sorry that you are not feeling well. Here is how we plan to help!    Based upon what you have shared with me it looks like you have a bacterial follicultits.  Folliculitis is inflammation of the hair follicles that can be caused by a superficial infection of the skin and is treated with an antibiotic. I have prescribed: and Keflex 500 mg three times per day for 7 days      HOME CARE:   Take cool showers and avoid direct sunlight.  Apply cool compress or wet dressings.  Take a bath in an oatmeal bath.  Sprinkle content of one Aveeno packet under running faucet with comfortably warm water.  Bathe for 15-20 minutes, 1-2 times daily.  Pat dry with a towel. Do not rub the rash.  Use hydrocortisone cream.  Take an antihistamine like Benadryl for widespread rashes that itch.  The adult dose of Benadryl is 25-50 mg by mouth 4 times daily.  Caution:  This type of medication may cause sleepiness.  Do not drink alcohol, drive, or operate dangerous machinery while taking antihistamines.  Do not take these medications if you have prostate enlargement.  Read package instructions thoroughly on all medications that you take.  GET HELP RIGHT AWAY IF:   Symptoms don't go away after treatment.  Severe itching that persists.  If you rash spreads or swells.  If you rash begins to smell.  If it blisters and opens or develops a yellow-brown crust.  You develop a fever.  You have a sore throat.  You become short of breath.  MAKE SURE YOU:  Understand these instructions. Will watch your condition. Will get help right away if you are not doing well or get worse.  Thank you for choosing an e-visit. Your e-visit answers were reviewed by a board certified advanced clinical practitioner to complete your personal care plan. Depending upon the condition, your plan could have included both over the counter or prescription medications. Please review your pharmacy choice.  Be sure that the pharmacy you have chosen is open so that you can pick up your prescription now.  If there is a problem you may message your provider in [place] to have the prescription routed to another pharmacy. Your safety is important to [place]. If you have drug allergies check your prescription carefully.  For the next 24 hours, you can use MyChart to ask questions about today's visit, request a non-urgent call back, or ask for a work or school excuse from your e-visit provider. You will get an email in the next two days asking about your experience. I hope that your e-visit has been valuable and will speed your recovery.  5 minutes spent reviewing and documenting in chart.

## 2018-07-11 NOTE — Telephone Encounter (Signed)
Called pt to schedule and he states he already did an e-visit. Will call next week if it doesn't improve.

## 2018-07-11 NOTE — Telephone Encounter (Signed)
Raquel Sarna, in office visit please. Thanks!

## 2018-08-08 ENCOUNTER — Other Ambulatory Visit: Payer: Self-pay | Admitting: Primary Care

## 2018-08-08 DIAGNOSIS — I10 Essential (primary) hypertension: Secondary | ICD-10-CM

## 2018-08-08 DIAGNOSIS — F411 Generalized anxiety disorder: Secondary | ICD-10-CM

## 2018-08-08 DIAGNOSIS — G4726 Circadian rhythm sleep disorder, shift work type: Secondary | ICD-10-CM

## 2018-08-08 DIAGNOSIS — K219 Gastro-esophageal reflux disease without esophagitis: Secondary | ICD-10-CM

## 2018-08-15 ENCOUNTER — Telehealth: Payer: Self-pay | Admitting: Primary Care

## 2018-08-15 NOTE — Telephone Encounter (Signed)
Pt scheduled appt for 6/3.

## 2018-08-15 NOTE — Telephone Encounter (Signed)
Patient due for follow up in early June 2020, please schedule.

## 2018-08-15 NOTE — Telephone Encounter (Signed)
Noted  

## 2018-09-17 ENCOUNTER — Ambulatory Visit: Payer: Managed Care, Other (non HMO) | Admitting: Primary Care

## 2018-09-17 ENCOUNTER — Other Ambulatory Visit: Payer: Self-pay

## 2018-09-17 ENCOUNTER — Encounter: Payer: Self-pay | Admitting: Primary Care

## 2018-09-17 VITALS — BP 126/84 | HR 81 | Temp 98.2°F | Ht 73.0 in | Wt 316.8 lb

## 2018-09-17 DIAGNOSIS — K219 Gastro-esophageal reflux disease without esophagitis: Secondary | ICD-10-CM | POA: Diagnosis not present

## 2018-09-17 DIAGNOSIS — I10 Essential (primary) hypertension: Secondary | ICD-10-CM

## 2018-09-17 DIAGNOSIS — F411 Generalized anxiety disorder: Secondary | ICD-10-CM | POA: Diagnosis not present

## 2018-09-17 DIAGNOSIS — G4726 Circadian rhythm sleep disorder, shift work type: Secondary | ICD-10-CM

## 2018-09-17 DIAGNOSIS — Z0000 Encounter for general adult medical examination without abnormal findings: Secondary | ICD-10-CM

## 2018-09-17 DIAGNOSIS — E785 Hyperlipidemia, unspecified: Secondary | ICD-10-CM

## 2018-09-17 MED ORDER — HYDROXYZINE HCL 25 MG PO TABS: 25.0000 mg | ORAL_TABLET | Freq: Every evening | ORAL | 0 refills | Status: DC | PRN

## 2018-09-17 MED ORDER — CITALOPRAM HYDROBROMIDE 40 MG PO TABS: 40.0000 mg | ORAL_TABLET | Freq: Every day | ORAL | 3 refills | Status: DC

## 2018-09-17 NOTE — Assessment & Plan Note (Signed)
Doing well on pantoprazole 40 mg, continue same. Encouraged weight loss.

## 2018-09-17 NOTE — Assessment & Plan Note (Signed)
Unclear whether his symptoms are from anxiety or lack of good sleep. Discussed options including switching from Citalopram to another SSRI vs trying another sleep aid.   He would like to stay with citalopram as he's done so well for so long, but he is interested in trying something else for sleep.   Will have him cut his trazodone in half for one week then stop. Rx for hydroxyzine HS sent for sleep. He will update in a few weeks.

## 2018-09-17 NOTE — Assessment & Plan Note (Signed)
Repeat lipids pending. Encouraged regular exercise and healthy diet.

## 2018-09-17 NOTE — Assessment & Plan Note (Signed)
Stable in the office today, continue current regimen.  CMP pending.

## 2018-09-17 NOTE — Patient Instructions (Signed)
Stop by the lab prior to leaving today. I will notify you of your results once received.   Cut the trazodone in half and take for one week then stop.  Continue citalopram 40 mg daily for anxiety and depression.  You may take the hydroxyzine at bedtime as needed for sleep.  Please update me regarding your sleep in a few weeks.  It was a pleasure to see you today!

## 2018-09-17 NOTE — Progress Notes (Signed)
Subjective:    Patient ID: Edward Day, male    DOB: [birth year], 33 y.o.   MRN: [id number]  HPI  Edward Day is a 33 year old male who presents today for complete physical and follow up. He is also needing refills.   1) Essential Hypertension: Currently managed on losartan 100 mg. He denies chest pain, shortness of breath, dizziness, headaches.   BP Readings from Last 3 Encounters:  09/17/18 126/84  05/06/18 136/86  03/08/18 (!) 144/96   2) GAD/Shift Work Sleep Disorder: Currently managed on Trazodone 150 mg, also on citalopram 40 mg. He is under a lot of stress with work and has been steadily having trouble sleeping. He is sleeping 3 hours total every night. Overall feels the citalopram 40 mg isn't as helpful as it once was. He continues to experience symptoms of anxiety including palpitations, difficulty sleeping, mind racing thoughts. The Trazodone isn't helping.  3) GERD: Currently managed on pantoprazole 40 mg and feels well managed. He denies breakthrough symptoms.   Immunizations: -Tetanus: Within the last 10 years -Influenza: Does not complete   Diet: He endorses a healthy diet with grilled protein, vegetables, whole grains, plenty of water. Denies fast food/fried food.   Exercise: He walks all day during work hours. No outside exercise.  Eye exam: Completed in Summer 2019 Dental exam: No recent exam   Review of Systems  Constitutional: Negative for unexpected weight change.  HENT: Negative for rhinorrhea.   Respiratory: Negative for cough and shortness of breath.   Cardiovascular: Negative for chest pain.  Gastrointestinal: Negative for constipation and diarrhea.  Genitourinary: Negative for difficulty urinating.  Musculoskeletal: Negative for myalgias.  Skin: Negative for rash.  Allergic/Immunologic: Negative for environmental allergies.  Neurological: Negative for dizziness, numbness and headaches.  Psychiatric/Behavioral:       See HPI       Past Medical  History:  Diagnosis Date  . DVT (deep venous thrombosis) ([place])   . GAD (generalized anxiety disorder)   . GERD (gastroesophageal reflux disease)   . H. pylori infection   . Hypertension   . Low testosterone   . Polycythemia   . Pulmonary embolism [facility])      Social History   Socioeconomic History  . Marital status: Married    Spouse name: Not on file  . Number of children: Not on file  . Years of education: Not on file  . Highest education level: Not on file  Occupational History  . Not on file  Social Needs  . Financial resource strain: Not on file  . Food insecurity:    Worry: Not on file    Inability: Not on file  . Transportation needs:    Medical: Not on file    Non-medical: Not on file  Tobacco Use  . Smoking status: Former Smoker    Packs/day: 0.50    Years: 15.00    Pack years: 7.50    Types: Cigarettes    Last attempt to quit: 05/24/2016    Years since quitting: 2.3  . Smokeless tobacco: Former Systems developer    Types: Snuff  . Tobacco comment: has cut back to one cig every 6 days 09/06/16  Substance and Sexual Activity  . Alcohol use: Yes    Alcohol/week: 0.0 standard drinks  . Drug use: No  . Sexual activity: Yes    Partners: Female  Lifestyle  . Physical activity:    Days per week: Not on file    Minutes per session:  Not on file  . Stress: Not on file  Relationships  . Social connections:    Talks on phone: Not on file    Gets together: Not on file    Attends religious service: Not on file    Active member of club or organization: Not on file    Attends meetings of clubs or organizations: Not on file    Relationship status: Not on file  . Intimate partner violence:    Fear of current or ex partner: Not on file    Emotionally abused: Not on file    Physically abused: Not on file    Forced sexual activity: Not on file  Other Topics Concern  . Not on file  Social History Narrative   Married.   1 child.   Works for [employer].    Enjo[place] lake, riding motor cycles.    Past Surgical History:  Procedure Laterality Date  . APPENDECTOMY  2003  . ELBOW SURGERY  2014  . FINGER SURGERY Right 2003  . KNEE SURGERY    . SHOULDER SURGERY  2009, 2013, 2013, 2014  . TONSILLECTOMY      Family History  Problem Relation Age of Onset  . Hypertension Mother   . Polycythemia Mother   . Hyperlipidemia Mother   . Diabetes Father   . Hypertension Father   . Cancer Maternal Grandmother        breast cancer  . Dementia Maternal Grandmother   . Diabetes Maternal Grandmother     Allergies  Allergen Reactions  . Banana Swelling and Anaphylaxis    Current Outpatient Medications on File Prior to Visit  Medication Sig Dispense Refill  . albuterol (PROVENTIL HFA;VENTOLIN HFA) 108 (90 Base) MCG/ACT inhaler Inhale 2 puffs into the lungs every 6 (six) hours as needed for wheezing or shortness of breath. 1 Inhaler 0  . citalopram (CELEXA) 40 MG tablet Take 1 tablet (40 mg total) by mouth daily. DUE FOR A FOLLOW UP APPOINTMENT IN THE SUMMER 90 tablet 0  . losartan (COZAAR) 100 MG tablet Take 1 tablet by mouth once daily for blood pressure. DUE FOR A FOLLOW UP APPOINTMENT IN THE SUMMER 90 tablet 0  . pantoprazole (PROTONIX) 40 MG tablet Take 1 tablet by mouth once daily for heartburn. DUE FOR A FOLLOW UP APPOINTMENT IN THE SUMMER 90 tablet 0  . traZODone (DESYREL) 150 MG tablet TAKE 1 TABLET BY MOUTH AT BEDTIME AS NEEDED FOR INSOMNIA  DUE FOR A FOLLOW UP APPOINTMENT IN THE SUMMER 90 tablet 0   No current facility-administered medications on file prior to visit.     BP 126/84   Pulse 81   Temp 98.2 F (36.8 C) (Tympanic)   Ht 6\' 1"  (1.854 m)   Wt (!) 316 lb 12 oz (143.7 kg)   SpO2 97%   BMI 41.79 kg/m    Objective:   Physical Exam  Constitutional: He is oriented to person, place, and time. He appears well-nourished.  HENT:  Mouth/Throat: No oropharyngeal exudate.  Eyes: Pupils are equal, round, and reactive  to light. EOM are normal.  Neck: Neck supple. No thyromegaly present.  Cardiovascular: Normal rate and regular rhythm.  Respiratory: Effort normal and breath sounds normal.  GI: Soft. Bowel sounds are normal. There is no abdominal tenderness.  Musculoskeletal: Normal range of motion.  Neurological: He is alert and oriented to person, place, and time.  Skin: Skin is warm and dry.  Psychiatric: He has  a normal mood and affect.           Assessment & Plan:

## 2018-09-18 LAB — COMPREHENSIVE METABOLIC PANEL WITH GFR
ALT: 59 U/L — ABNORMAL HIGH (ref 0–53)
AST: 33 U/L (ref 0–37)
Albumin: 4 g/dL (ref 3.5–5.2)
Alkaline Phosphatase: 65 U/L (ref 39–117)
BUN: 10 mg/dL (ref 6–23)
CO2: 27 meq/L (ref 19–32)
Calcium: 9.4 mg/dL (ref 8.4–10.5)
Chloride: 104 meq/L (ref 96–112)
Creatinine, Ser: 0.77 mg/dL (ref 0.40–1.50)
GFR: 115.5 mL/min
Glucose, Bld: 87 mg/dL (ref 70–99)
Potassium: 4.5 meq/L (ref 3.5–5.1)
Sodium: 141 meq/L (ref 135–145)
Total Bilirubin: 0.2 mg/dL (ref 0.2–1.2)
Total Protein: 7.4 g/dL (ref 6.0–8.3)

## 2018-09-18 LAB — LDL CHOLESTEROL, DIRECT: Direct LDL: 150 mg/dL

## 2018-09-18 LAB — LIPID PANEL
Cholesterol: 208 mg/dL — ABNORMAL HIGH (ref 0–200)
HDL: 43.5 mg/dL
NonHDL: 164.4
Total CHOL/HDL Ratio: 5
Triglycerides: 262 mg/dL — ABNORMAL HIGH (ref 0.0–149.0)
VLDL: 52.4 mg/dL — ABNORMAL HIGH (ref 0.0–40.0)

## 2018-09-18 LAB — HEMOGLOBIN A1C: Hgb A1c MFr Bld: 5.8 % (ref 4.6–6.5)

## 2018-09-18 LAB — TSH: TSH: 0.68 u[IU]/mL (ref 0.35–4.50)

## 2018-09-22 ENCOUNTER — Telehealth: Payer: Self-pay | Admitting: Primary Care

## 2018-09-22 NOTE — Telephone Encounter (Signed)
Pt came in and dropped off wellness screening info sheet. Patient want seen on 09/17/2018. Please call patient once form is filled out. Forms will be in provider box at the front.

## 2018-09-23 NOTE — Telephone Encounter (Signed)
Place form in Kate's inbox.

## 2018-09-23 NOTE — Telephone Encounter (Signed)
Completed and placed in Chans inbox. 

## 2018-09-23 NOTE — Telephone Encounter (Addendum)
Per DPR, left detail message of Tawni Millers comments for patient for patient that form has been faxed and original copy place in the mail for patient.

## 2018-10-31 ENCOUNTER — Other Ambulatory Visit: Payer: Self-pay

## 2018-10-31 DIAGNOSIS — Z20822 Contact with and (suspected) exposure to covid-19: Secondary | ICD-10-CM

## 2018-11-05 LAB — NOVEL CORONAVIRUS, NAA: SARS-CoV-2, NAA: DETECTED — AB

## 2018-11-05 NOTE — Telephone Encounter (Signed)
Spoken to patient and explain the lab results.

## 2018-11-05 NOTE — Telephone Encounter (Signed)
Pt left v/m that he does not understand covid testing results in my chart and pt request cb at [phone]. Per lab results for covid testing the result was detected.FYI to Gentry Fitz NP and Vallarie Mare CMA.

## 2018-11-05 NOTE — Telephone Encounter (Signed)
Message left for patient to return my call.  

## 2018-11-18 ENCOUNTER — Telehealth: Payer: Managed Care, Other (non HMO) | Admitting: Family

## 2018-11-18 DIAGNOSIS — L237 Allergic contact dermatitis due to plants, except food: Secondary | ICD-10-CM | POA: Diagnosis not present

## 2018-11-18 MED ORDER — PREDNISONE 10 MG (21) PO TBPK: ORAL_TABLET | ORAL | 0 refills | Status: DC

## 2018-11-18 NOTE — Progress Notes (Signed)

## 2018-12-23 ENCOUNTER — Other Ambulatory Visit: Payer: Self-pay | Admitting: Primary Care

## 2018-12-23 DIAGNOSIS — I10 Essential (primary) hypertension: Secondary | ICD-10-CM

## 2018-12-23 DIAGNOSIS — F411 Generalized anxiety disorder: Secondary | ICD-10-CM

## 2018-12-23 DIAGNOSIS — G4726 Circadian rhythm sleep disorder, shift work type: Secondary | ICD-10-CM

## 2018-12-23 DIAGNOSIS — K219 Gastro-esophageal reflux disease without esophagitis: Secondary | ICD-10-CM

## 2018-12-24 MED ORDER — TRAZODONE HCL 150 MG PO TABS: ORAL_TABLET | ORAL | 0 refills | Status: DC

## 2018-12-24 NOTE — Telephone Encounter (Signed)
Per patient's request. Will refill Trazodone 150 mg for patient. Will decline refill request for hydroxyzine 25 mg since patient overslept with medication and he already stopped taking this. He already return to taking Trazodone back in June.

## 2019-04-15 ENCOUNTER — Ambulatory Visit: Payer: BC Managed Care – PPO | Attending: Internal Medicine

## 2019-04-15 DIAGNOSIS — Z20828 Contact with and (suspected) exposure to other viral communicable diseases: Secondary | ICD-10-CM | POA: Diagnosis not present

## 2019-04-15 DIAGNOSIS — Z20822 Contact with and (suspected) exposure to covid-19: Secondary | ICD-10-CM

## 2019-04-16 LAB — NOVEL CORONAVIRUS, NAA: SARS-CoV-2, NAA: NOT DETECTED

## 2019-05-03 ENCOUNTER — Other Ambulatory Visit: Payer: Self-pay

## 2019-05-03 ENCOUNTER — Emergency Department (HOSPITAL_COMMUNITY)
Admission: EM | Admit: 2019-05-03 | Discharge: 2019-05-03 | Disposition: A | Discharge status: Home / Self Care | Payer: BC Managed Care – PPO | Attending: Emergency Medicine | Admitting: Emergency Medicine

## 2019-05-03 ENCOUNTER — Emergency Department (HOSPITAL_COMMUNITY): Payer: BC Managed Care – PPO

## 2019-05-03 ENCOUNTER — Encounter (HOSPITAL_COMMUNITY): Payer: Self-pay | Admitting: Emergency Medicine

## 2019-05-03 DIAGNOSIS — Y929 Unspecified place or not applicable: Secondary | ICD-10-CM | POA: Diagnosis not present

## 2019-05-03 DIAGNOSIS — Z79899 Other long term (current) drug therapy: Secondary | ICD-10-CM | POA: Diagnosis not present

## 2019-05-03 DIAGNOSIS — Z87891 Personal history of nicotine dependence: Secondary | ICD-10-CM | POA: Diagnosis not present

## 2019-05-03 DIAGNOSIS — Z86711 Personal history of pulmonary embolism: Secondary | ICD-10-CM | POA: Insufficient documentation

## 2019-05-03 DIAGNOSIS — S39012A Strain of muscle, fascia and tendon of lower back, initial encounter: Secondary | ICD-10-CM | POA: Insufficient documentation

## 2019-05-03 DIAGNOSIS — Y33XXXA Other specified events, undetermined intent, initial encounter: Secondary | ICD-10-CM | POA: Diagnosis not present

## 2019-05-03 DIAGNOSIS — Y999 Unspecified external cause status: Secondary | ICD-10-CM | POA: Insufficient documentation

## 2019-05-03 DIAGNOSIS — Y939 Activity, unspecified: Secondary | ICD-10-CM | POA: Diagnosis not present

## 2019-05-03 DIAGNOSIS — I10 Essential (primary) hypertension: Secondary | ICD-10-CM | POA: Diagnosis not present

## 2019-05-03 DIAGNOSIS — S3992XA Unspecified injury of lower back, initial encounter: Secondary | ICD-10-CM | POA: Diagnosis not present

## 2019-05-03 DIAGNOSIS — Z86718 Personal history of other venous thrombosis and embolism: Secondary | ICD-10-CM | POA: Diagnosis not present

## 2019-05-03 DIAGNOSIS — M546 Pain in thoracic spine: Secondary | ICD-10-CM | POA: Diagnosis not present

## 2019-05-03 DIAGNOSIS — M545 Low back pain: Secondary | ICD-10-CM | POA: Diagnosis not present

## 2019-05-03 MED ORDER — IBUPROFEN 400 MG PO TABS: 400.0000 mg | ORAL_TABLET | Freq: Three times a day (TID) | ORAL | 0 refills | Status: DC | PRN

## 2019-05-03 MED ORDER — HYDROCODONE-ACETAMINOPHEN 5-325 MG PO TABS: 1.0000 | ORAL_TABLET | Freq: Four times a day (QID) | ORAL | 0 refills | Status: DC | PRN

## 2019-05-03 MED ORDER — CYCLOBENZAPRINE HCL 10 MG PO TABS: 10.0000 mg | ORAL_TABLET | Freq: Two times a day (BID) | ORAL | 0 refills | Status: DC | PRN

## 2019-05-03 MED ORDER — HYDROCODONE-ACETAMINOPHEN 5-325 MG PO TABS
1.0000 | ORAL_TABLET | ORAL | Status: AC
  Administered 2019-05-03: 1 via ORAL
  Filled 2019-05-03: qty 1

## 2019-05-03 NOTE — ED Provider Notes (Signed)
[name]   CSN: [id number] Arrival date & time: 05/03/19  1128     History Chief Complaint  Patient presents with  . Back Pain    Edward Day is a 34 y.o. male.  HPI   Patient states he fell on Monday.  Initially was not having that much discomfort but as the week progressed he started having increasing pain in his lower back.  Patient states it is worse with movement and better when he is lying still.  He denies any numbness or tingling in his legs.  He denies any radiation of the pain in his legs.  No loss of bowel or bladder continence.  Past Medical History:  Diagnosis Date  . DVT (deep venous thrombosis) ([place])   . GAD (generalized anxiety disorder)   . GERD (gastroesophageal reflux disease)   . H. pylori infection   . Hypertension   . Low testosterone   . Polycythemia   . Pulmonary embolism [facility])     Patient Active Problem List   Diagnosis Date Noted  . Facial cellulitis 03/07/2018  . Plant irritant contact dermatitis 02/28/2018  . GAD (generalized anxiety disorder) 09/20/2017  . Gastroesophageal reflux disease 09/20/2017  . 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 Pain in right knee 04/22/2017  . Pulmonary embolism ([place]) 11/23/2016  . Chest pain 11/11/2013  . Hyperlipidemia 11/11/2013  . Essential hypertension 09/25/2006    Past Surgical History:  Procedure Laterality Date  . APPENDECTOMY  2003  . ELBOW SURGERY  2014  . FINGER SURGERY Right 2003  . KNEE SURGERY    . SHOULDER SURGERY  2009, 2013, 2013, 2014  . TONSILLECTOMY         Family History  Problem Relation Age of Onset  . Hypertension Mother   . Polycythemia Mother   . Hyperlipidemia Mother   . Diabetes Father   . Hypertension Father   . Cancer Maternal Grandmother        breast cancer  . Dementia Maternal Grandmother   . Diabetes Maternal Grandmother     Social History   Tobacco Use  . Smoking status: Former Smoker   Packs/day: 0.50    Years: 15.00    Pack years: 7.50    Types: Cigarettes    Quit date: 05/24/2016    Years since quitting: 2.9  . Smokeless tobacco: Former Systems developer    Types: Snuff  . Tobacco comment: has cut back to one cig every 6 days 09/06/16  Substance Use Topics  . Alcohol use: Yes    Alcohol/week: 0.0 standard drinks  . Drug use: No    Home Medications Prior to Admission medications   Medication Sig Start Date End Date Taking? Authorizing Provider  albuterol (PROVENTIL HFA;VENTOLIN HFA) 108 (90 Base) MCG/ACT inhaler Inhale 2 puffs into the lungs every 6 (six) hours as needed for wheezing or shortness of breath. 05/14/18   Ria Bush, MD  citalopram (CELEXA) 40 MG tablet Take 1 tablet (40 mg total) by mouth daily. For anxiety. 09/17/18   Pleas Koch, NP  cyclobenzaprine (FLEXERIL) 10 MG tablet Take 1 tablet (10 mg total) by mouth 2 (two) times daily as needed for muscle spasms. 05/03/19   Dorie Rank, MD  HYDROcodone-acetaminophen (NORCO/VICODIN) 5-325 MG tablet Take 1 tablet by mouth every 6 (six) hours as needed. 05/03/19   Dorie Rank, MD  ibuprofen (ADVIL) 400 MG tablet Take 1 tablet (400 mg total) by mouth every 8 (eight) hours  as needed. 05/03/19   Dorie Rank, MD  losartan (COZAAR) 100 MG tablet TAKE 1 TABLET BY MOUTH ONCE DAILY FOR BLOOD PRESSURE 12/24/18   Pleas Koch, NP  pantoprazole (PROTONIX) 40 MG tablet TAKE 1 TABLET BY MOUTH ONCE DAILY FOR HEARTBURN 12/24/18   Pleas Koch, NP  predniSONE (STERAPRED UNI-PAK 21 TAB) 10 MG (21) TBPK tablet As directed 11/18/18   Dutch Quint B, FNP  traZODone (DESYREL) 150 MG tablet TAKE 1 TABLET BY MOUTH EVERY NIGHT AT BEDTIME AS NEEDED FOR INSOMNIA. 12/24/18   Pleas Koch, NP    Allergies    Banana  Review of Systems   Review of Systems  All other systems reviewed and are negative.   Physical Exam Updated Vital Signs BP (!) 157/107 (BP Location: Right Arm)   Pulse 78   Temp 98.4 F (36.9 C) (Oral)   Resp 20    SpO2 97%   Physical Exam Vitals and nursing note reviewed.  Constitutional:      General: He is not in acute distress.    Appearance: He is well-developed.  HENT:     Head: Normocephalic and atraumatic.     Right Ear: External ear normal.     Left Ear: External ear normal.  Eyes:     General: No scleral icterus.       Right eye: No discharge.        Left eye: No discharge.     Conjunctiva/sclera: Conjunctivae normal.  Neck:     Trachea: No tracheal deviation.  Cardiovascular:     Rate and Rhythm: Normal rate.  Pulmonary:     Effort: Pulmonary effort is normal. No respiratory distress.     Breath sounds: No stridor.  Abdominal:     General: There is no distension.  Musculoskeletal:        General: No swelling or deformity.     Cervical back: Neck supple.     Thoracic back: Tenderness present. No edema.     Lumbar back: Tenderness present. No edema.  Skin:    General: Skin is warm and dry.     Findings: No rash.  Neurological:     Mental Status: He is alert.     Cranial Nerves: Cranial nerve deficit: no gross deficits.     Comments: Normal sensation bilateral lower extremities, 5 out of 5 strength bilateral lower extremities     ED Results / Procedures / Treatments   Labs (all labs ordered are listed, but only abnormal results are displayed) Labs Reviewed - No data to display  EKG None  Radiology DG Thoracic Spine 2 View  Result Date: 05/03/2019 CLINICAL DATA:  Back pain after fall EXAM: THORACIC SPINE 2 VIEWS COMPARISON:  None. FINDINGS: There is no evidence of thoracic spine fracture. Alignment is normal. No other significant bone abnormalities are identified. IMPRESSION: Negative. Electronically Signed   By: Davina Poke D.O.   On: 05/03/2019 14:06   DG Lumbar Spine Complete  Result Date: 05/03/2019 CLINICAL DATA:  Back pain after fall EXAM: LUMBAR SPINE - COMPLETE 4+ VIEW COMPARISON:  None. FINDINGS: There is no evidence of acute lumbar spine fracture.  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 Intervertebral disc spaces are maintained. IMPRESSION: 1. No evidence of acute lumbar spine fracture. 2. 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 Electronically Signed   By: Davina Poke D.O.   On: 05/03/2019 14:10  Procedures Procedures (including critical care time)  Medications Ordered in ED Medications  HYDROcodone-acetaminophen (NORCO/VICODIN) 5-325 MG per tablet 1 tablet (1 tablet Oral Given 05/03/19 1401)    ED Course  I have reviewed the triage vital signs and the nursing notes.  Pertinent labs & imaging results that were available during my care of the patient were reviewed by me and considered in my medical decision making (see chart for details).    MDM Rules/Calculators/A&P                      No signs of infection.  Normal neuro exam, no findings to suggest sciatica.  Will treat for lumbar strain.  PDMP reviewed. Final Clinical Impression(s) / ED Diagnoses Final diagnoses:  Strain of lumbar region, initial encounter    Rx / DC Orders ED Discharge Orders         Ordered    HYDROcodone-acetaminophen (NORCO/VICODIN) 5-325 MG tablet  Every 6 hours PRN     05/03/19 1505    cyclobenzaprine (FLEXERIL) 10 MG tablet  2 times daily PRN     05/03/19 1505    ibuprofen (ADVIL) 400 MG tablet  Every 8 hours PRN     05/03/19 1505           Dorie Rank, MD 05/03/19 1507

## 2019-05-03 NOTE — Discharge Instructions (Signed)
Take the medications as needed for pain, follow up with your doctor next week to be rechecked if the symptoms have not resolved, monitor for fever, numbness, weakness

## 2019-05-03 NOTE — ED Triage Notes (Signed)
Pt states he fell on Monday, had some lower back pain throughout the week and states that yesterday he got up from his chair and pain worsened. Has taken ibuprofen and aleve without relief of pain, denies numbness or tingling, no loss of bowel or bladder.

## 2019-05-03 NOTE — ED Notes (Signed)
Patient transported to X-ray 

## 2019-06-09 ENCOUNTER — Encounter: Payer: Self-pay | Admitting: Internal Medicine

## 2019-06-09 ENCOUNTER — Ambulatory Visit (INDEPENDENT_AMBULATORY_CARE_PROVIDER_SITE_OTHER): Payer: BC Managed Care – PPO | Admitting: Internal Medicine

## 2019-06-09 ENCOUNTER — Other Ambulatory Visit: Payer: Self-pay

## 2019-06-09 DIAGNOSIS — J0110 Acute frontal sinusitis, unspecified: Secondary | ICD-10-CM

## 2019-06-09 MED ORDER — AMOXICILLIN-POT CLAVULANATE 875-125 MG PO TABS: 1.0000 | ORAL_TABLET | Freq: Two times a day (BID) | ORAL | 0 refills | Status: DC

## 2019-06-09 MED ORDER — BENZONATATE 200 MG PO CAPS: 200.0000 mg | ORAL_CAPSULE | Freq: Three times a day (TID) | ORAL | 0 refills | Status: DC | PRN

## 2019-06-09 NOTE — Patient Instructions (Signed)

## 2019-06-09 NOTE — Progress Notes (Signed)
Virtual Visit via Video Note  I connected with Edward Day on 06/09/19 at  3:00 PM EST by a video enabled telemedicine application and verified that I am speaking with the correct person using two identifiers.  Location: Patient: At work Provider: Office   I discussed the limitations of evaluation and management by telemedicine and the availability of in person appointments. The patient expressed understanding and agreed to proceed.  HPI  Patient reports headache, facial pain and pressure, nasal congestion, postnasal drip and cough.  He reports this started 5 days ago.  The headache is located in his forehead and behind his eyes.  He is blowing yellow mucus out of his nose.  He does report some ear fullness but denies ear pain, drainage or decreased hearing.  He denies difficulty swallowing.  The cough is mostly nonproductive, worse at night.  He denies runny nose, loss of taste or smell or shortness of breath.  He denies fever, chills or body aches.  He has tried Mucinex and Tylenol Sinus with minimal relief.  He has not had sick contacts or exposure to Covid that he is aware of.  Review of Systems     Past Medical History:  Diagnosis Date  . DVT (deep venous thrombosis) ([place])   . GAD (generalized anxiety disorder)   . GERD (gastroesophageal reflux disease)   . H. pylori infection   . Hypertension   . Low testosterone   . Polycythemia   . Pulmonary embolism ([facility])     Family History  Problem Relation Age of Onset  . Hypertension Mother   . Polycythemia Mother   . Hyperlipidemia Mother   . Diabetes Father   . Hypertension Father   . Cancer Maternal Grandmother        breast cancer  . Dementia Maternal Grandmother   . Diabetes Maternal Grandmother     Social History   Socioeconomic History  . Marital status: Married    Spouse name: Not on file  . Number of children: Not on file  . Years of education: Not on file  . Highest education level: Not on file  Occupational  History  . Not on file  Tobacco Use  . Smoking status: Former Smoker    Packs/day: 0.50    Years: 15.00    Pack years: 7.50    Types: Cigarettes    Quit date: 05/24/2016    Years since quitting: 3.0  . Smokeless tobacco: Former Systems developer    Types: Snuff  . Tobacco comment: has cut back to one cig every 6 days 09/06/16  Substance and Sexual Activity  . Alcohol use: Yes    Alcohol/week: 0.0 standard drinks  . Drug use: No  . Sexual activity: Yes    Partners: Female  Other Topics Concern  . Not on file  Social History Narrative   Married.   1 child.   Works for [employer].   Enjo[place] going [place] the lake, riding motor cycles.   Social Determinants of Health   Financial Resource Strain:   . Difficulty of Paying Living Expenses: Not on file  Food Insecurity:   . Worried About Charity fundraiser in the Last Year: Not on file  . Ran Out of Food in the Last Year: Not on file  Transportation Needs:   . Lack of Transportation (Medical): Not on file  . Lack of Transportation (Non-Medical): Not on file  Physical Activity:   . Days of Exercise per Week: Not  on file  . Minutes of Exercise per Session: Not on file  Stress:   . Feeling of Stress : Not on file  Social Connections:   . Frequency of Communication with Friends and Family: Not on file  . Frequency of Social Gatherings with Friends and Family: Not on file  . Attends Religious Services: Not on file  . Active Member of Clubs or Organizations: Not on file  . Attends Archivist Meetings: Not on file  . Marital Status: Not on file  Intimate Partner Violence:   . Fear of Current or Ex-Partner: Not on file  . Emotionally Abused: Not on file  . Physically Abused: Not on file  . Sexually Abused: Not on file    Allergies  Allergen Reactions  . Banana Swelling and Anaphylaxis     Constitutional: Positive headache. Denies fatigue, fever or abrupt weight changes.  HEENT:  Positive facial pain,  nasal congestion and PND. Denies eye redness, ear pain, ringing in the ears, wax buildup, runny nose or sore throat. Respiratory: Positive cough. Denies difficulty breathing or shortness of breath.  Cardiovascular: Denies chest pain, chest tightness, palpitations or swelling in the hands or feet.   No other specific complaints in a complete review of systems (except as listed in HPI above).  Objective:    General: Appears his stated age, obese in NAD. HEENT: Head: normal shape and size, frontal sinus tenderness per his report; Nose: nasal congestion noted; Throat/Mouth: Hoarseness noted. Pulmonary/Chest: Normal effort. No respiratory distress.        Assessment & Plan:   Acute Frontal Sinusitis  Can use a Neti Pot which can be purchased from your local drug store. Flonase 2 sprays each nostril for 3 days and then as needed. eRx for Augmentin BID for 10 days  RTC as needed or if symptoms persist. Webb Silversmith, NP    Follow Up Instructions:    I discussed the assessment and treatment plan with the patient. The patient was provided an opportunity to ask questions and all were answered. The patient agreed with the plan and demonstrated an understanding of the instructions.   The patient was advised to call back or seek an in-person evaluation if the symptoms worsen or if the condition fails to improve as anticipated.     Webb Silversmith, NP

## 2019-07-23 ENCOUNTER — Other Ambulatory Visit: Payer: Self-pay | Admitting: Primary Care

## 2019-07-23 DIAGNOSIS — K219 Gastro-esophageal reflux disease without esophagitis: Secondary | ICD-10-CM

## 2019-07-23 DIAGNOSIS — I10 Essential (primary) hypertension: Secondary | ICD-10-CM

## 2019-08-05 ENCOUNTER — Encounter (HOSPITAL_COMMUNITY): Payer: Self-pay | Admitting: Emergency Medicine

## 2019-08-05 ENCOUNTER — Other Ambulatory Visit: Payer: Self-pay

## 2019-08-05 ENCOUNTER — Emergency Department (HOSPITAL_COMMUNITY): Payer: BC Managed Care – PPO

## 2019-08-05 ENCOUNTER — Emergency Department (HOSPITAL_COMMUNITY)
Admission: EM | Admit: 2019-08-05 | Discharge: 2019-08-06 | Disposition: A | Discharge status: Home / Self Care | Payer: BC Managed Care – PPO | Attending: Emergency Medicine | Admitting: Emergency Medicine

## 2019-08-05 DIAGNOSIS — M25562 Pain in left knee: Secondary | ICD-10-CM | POA: Insufficient documentation

## 2019-08-05 DIAGNOSIS — R2242 Localized swelling, mass and lump, left lower limb: Secondary | ICD-10-CM | POA: Diagnosis not present

## 2019-08-05 DIAGNOSIS — Z5321 Procedure and treatment not carried out due to patient leaving prior to being seen by health care provider: Secondary | ICD-10-CM | POA: Insufficient documentation

## 2019-08-05 DIAGNOSIS — R600 Localized edema: Secondary | ICD-10-CM | POA: Diagnosis not present

## 2019-08-05 DIAGNOSIS — M7989 Other specified soft tissue disorders: Secondary | ICD-10-CM | POA: Diagnosis not present

## 2019-08-05 NOTE — ED Notes (Signed)
Pt came to this tech requesting to be taken out of system because he was leaving, pt was encouraged to stay. Pt left

## 2019-08-05 NOTE — ED Triage Notes (Signed)
Patient lost his balance and fell on his left knee this afternoon , reports pain with mild swelling at left knee , ambulatory/no LOC .

## 2019-10-05 ENCOUNTER — Other Ambulatory Visit: Payer: Self-pay | Admitting: Primary Care

## 2019-10-05 DIAGNOSIS — F411 Generalized anxiety disorder: Secondary | ICD-10-CM

## 2019-11-04 DIAGNOSIS — F411 Generalized anxiety disorder: Secondary | ICD-10-CM

## 2019-11-04 MED ORDER — CITALOPRAM HYDROBROMIDE 10 MG PO TABS: 10.0000 mg | ORAL_TABLET | Freq: Every day | ORAL | 0 refills | Status: DC

## 2019-11-14 DIAGNOSIS — T63461A Toxic effect of venom of wasps, accidental (unintentional), initial encounter: Secondary | ICD-10-CM | POA: Diagnosis not present

## 2019-12-29 DIAGNOSIS — Z03818 Encounter for observation for suspected exposure to other biological agents ruled out: Secondary | ICD-10-CM | POA: Diagnosis not present

## 2019-12-29 DIAGNOSIS — Z20822 Contact with and (suspected) exposure to covid-19: Secondary | ICD-10-CM | POA: Diagnosis not present

## 2019-12-29 DIAGNOSIS — R519 Headache, unspecified: Secondary | ICD-10-CM | POA: Diagnosis not present

## 2019-12-30 ENCOUNTER — Other Ambulatory Visit: Payer: Self-pay | Admitting: Primary Care

## 2019-12-30 DIAGNOSIS — U071 COVID-19: Secondary | ICD-10-CM

## 2019-12-30 NOTE — Progress Notes (Signed)
I connected by phone with Edward Day on 12/30/2019 at 7:50 PM to discuss the potential use of a new treatment for mild to moderate COVID-19 viral infection in non-hospitalized patients.  This patient is a 34 y.o. male that meets the FDA criteria for Emergency Use Authorization of COVID monoclonal antibody casirivimab/imdevimab.  Has a (+) direct SARS-CoV-2 viral test result  Has mild or moderate COVID-19   Is NOT hospitalized due to COVID-19  Is within 10 days of symptom onset  Has at least one of the high risk factor(s) for progression to severe COVID-19 and/or hospitalization as defined in EUA.  Specific high risk criteria : BMI, history of PE, hypertension   I have spoken and communicated the following to the patient or parent/caregiver regarding COVID monoclonal antibody treatment:  1. FDA has authorized the emergency use for the treatment of mild to moderate COVID-19 in adults and pediatric patients with positive results of direct SARS-CoV-2 viral testing who are 37 years of age and older weighing at least 40 kg, and who are at high risk for progressing to severe COVID-19 and/or hospitalization.  2. The significant known and potential risks and benefits of COVID monoclonal antibody, and the extent to which such potential risks and benefits are unknown.  3. Information on available alternative treatments and the risks and benefits of those alternatives, including clinical trials.  4. Patients treated with COVID monoclonal antibody should continue to self-isolate and use infection control measures (e.g., wear mask, isolate, social distance, avoid sharing personal items, clean and disinfect "high touch" surfaces, and frequent handwashing) according to CDC guidelines.   5. The patient or parent/caregiver has the option to accept or refuse COVID monoclonal antibody treatment.  After reviewing this information with the patient, accepts treatment. Pleas Koch 12/30/2019 7:50  PM

## 2019-12-31 ENCOUNTER — Ambulatory Visit (HOSPITAL_COMMUNITY)
Admission: RE | Admit: 2019-12-31 | Discharge: 2019-12-31 | Disposition: A | Discharge status: Home / Self Care | Payer: BC Managed Care – PPO | Source: Ambulatory Visit | Attending: Pulmonary Disease | Admitting: Pulmonary Disease

## 2019-12-31 DIAGNOSIS — U071 COVID-19: Secondary | ICD-10-CM | POA: Insufficient documentation

## 2019-12-31 MED ORDER — DIPHENHYDRAMINE HCL 50 MG/ML IJ SOLN: 50.0000 mg | Freq: Once | INTRAMUSCULAR | Status: DC | PRN

## 2019-12-31 MED ORDER — SODIUM CHLORIDE 0.9 % IV SOLN
1200.0000 mg | Freq: Once | INTRAVENOUS | Status: AC
  Administered 2019-12-31: 1200 mg via INTRAVENOUS

## 2019-12-31 MED ORDER — METHYLPREDNISOLONE SODIUM SUCC 125 MG IJ SOLR: 125.0000 mg | Freq: Once | INTRAMUSCULAR | Status: DC | PRN

## 2019-12-31 MED ORDER — SODIUM CHLORIDE 0.9 % IV SOLN: INTRAVENOUS | Status: DC | PRN

## 2019-12-31 MED ORDER — FAMOTIDINE IN NACL 20-0.9 MG/50ML-% IV SOLN: 20.0000 mg | Freq: Once | INTRAVENOUS | Status: DC | PRN

## 2019-12-31 MED ORDER — EPINEPHRINE 0.3 MG/0.3ML IJ SOAJ: 0.3000 mg | Freq: Once | INTRAMUSCULAR | Status: DC | PRN

## 2019-12-31 MED ORDER — ALBUTEROL SULFATE HFA 108 (90 BASE) MCG/ACT IN AERS: 2.0000 | INHALATION_SPRAY | Freq: Once | RESPIRATORY_TRACT | Status: DC | PRN

## 2019-12-31 NOTE — Discharge Instructions (Signed)

## 2019-12-31 NOTE — Progress Notes (Signed)
  Diagnosis: COVID-19  Physician:Dr. Wright Procedure: [facility] Med: casirivimab\imdevimab infusion - Provided patient with casirivimab\imdevimab fact sheet for patients, parents and caregivers prior to infusion.  Complications: No immediate complications noted.  Discharge: Discharged home   Edward Day S [name] 12/31/2019  

## 2020-01-06 DIAGNOSIS — Z20822 Contact with and (suspected) exposure to covid-19: Secondary | ICD-10-CM | POA: Diagnosis not present

## 2020-01-06 DIAGNOSIS — Z03818 Encounter for observation for suspected exposure to other biological agents ruled out: Secondary | ICD-10-CM | POA: Diagnosis not present

## 2020-01-06 DIAGNOSIS — R519 Headache, unspecified: Secondary | ICD-10-CM | POA: Diagnosis not present

## 2020-01-08 DIAGNOSIS — Z03818 Encounter for observation for suspected exposure to other biological agents ruled out: Secondary | ICD-10-CM | POA: Diagnosis not present

## 2020-01-08 DIAGNOSIS — Z20822 Contact with and (suspected) exposure to covid-19: Secondary | ICD-10-CM | POA: Diagnosis not present

## 2020-01-08 DIAGNOSIS — R519 Headache, unspecified: Secondary | ICD-10-CM | POA: Diagnosis not present

## 2020-01-25 ENCOUNTER — Telehealth: Payer: Self-pay | Admitting: Primary Care

## 2020-01-25 ENCOUNTER — Other Ambulatory Visit: Payer: Self-pay

## 2020-01-25 DIAGNOSIS — I10 Essential (primary) hypertension: Secondary | ICD-10-CM

## 2020-01-25 DIAGNOSIS — K219 Gastro-esophageal reflux disease without esophagitis: Secondary | ICD-10-CM

## 2020-01-25 MED ORDER — LOSARTAN POTASSIUM 100 MG PO TABS: ORAL_TABLET | ORAL | 0 refills | Status: DC

## 2020-01-25 MED ORDER — PANTOPRAZOLE SODIUM 40 MG PO TBEC: DELAYED_RELEASE_TABLET | ORAL | 0 refills | Status: DC

## 2020-01-25 NOTE — Telephone Encounter (Signed)
Called patient let him know that we have called in refill to last until follow up. He has had a few readings that have been high when he has gone for his infusion. He will check readings for next two weeks at least 3 times a week and send in my chart. 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 Informed to make sure he has been resting for 10 min or more before he checks and feet are flat on ground.

## 2020-01-25 NOTE — Telephone Encounter (Signed)
Noted  

## 2020-01-25 NOTE — Telephone Encounter (Signed)
Patient scheduled cpx for 03/03/20.  Patient ran out of Losartan today and he, also, needs a refill on Pantoprazole.  Patient uses [facility].  Patient said his blood pressure has been running high. He said he'd take his blood pressure tonight and send the results on my chart.

## 2020-02-03 ENCOUNTER — Other Ambulatory Visit: Payer: Self-pay | Admitting: Primary Care

## 2020-02-03 DIAGNOSIS — F411 Generalized anxiety disorder: Secondary | ICD-10-CM

## 2020-02-05 NOTE — Telephone Encounter (Signed)
Spoke with pt asking about Celexa 40 mg.  States he weaned himself off of Celexa all together.  He does not need nor want a refill.  FYI to [place].   Denied request.

## 2020-02-05 NOTE — Telephone Encounter (Signed)
Rx request is for the 40 mg dose, yet he has a 10 mg dose on his medication list. Did he stop the citalopram 40 mg at some point? How long?

## 2020-02-05 NOTE — Telephone Encounter (Signed)
CPE scheduled on 03/03/20.  Ok to refill?

## 2020-02-12 DIAGNOSIS — Z03818 Encounter for observation for suspected exposure to other biological agents ruled out: Secondary | ICD-10-CM | POA: Diagnosis not present

## 2020-02-12 DIAGNOSIS — Z1152 Encounter for screening for COVID-19: Secondary | ICD-10-CM | POA: Diagnosis not present

## 2020-02-16 DIAGNOSIS — Z1152 Encounter for screening for COVID-19: Secondary | ICD-10-CM | POA: Diagnosis not present

## 2020-02-16 DIAGNOSIS — Z03818 Encounter for observation for suspected exposure to other biological agents ruled out: Secondary | ICD-10-CM | POA: Diagnosis not present

## 2020-03-03 ENCOUNTER — Ambulatory Visit (INDEPENDENT_AMBULATORY_CARE_PROVIDER_SITE_OTHER): Payer: BC Managed Care – PPO | Admitting: Primary Care

## 2020-03-03 ENCOUNTER — Encounter: Payer: Self-pay | Admitting: Primary Care

## 2020-03-03 ENCOUNTER — Telehealth: Payer: Self-pay

## 2020-03-03 ENCOUNTER — Other Ambulatory Visit: Payer: Self-pay

## 2020-03-03 VITALS — BP 134/86 | HR 99 | Temp 97.6°F | Ht 73.0 in | Wt 294.0 lb

## 2020-03-03 DIAGNOSIS — F411 Generalized anxiety disorder: Secondary | ICD-10-CM

## 2020-03-03 DIAGNOSIS — Z0001 Encounter for general adult medical examination with abnormal findings: Secondary | ICD-10-CM | POA: Diagnosis not present

## 2020-03-03 DIAGNOSIS — K219 Gastro-esophageal reflux disease without esophagitis: Secondary | ICD-10-CM

## 2020-03-03 DIAGNOSIS — Z0000 Encounter for general adult medical examination without abnormal findings: Secondary | ICD-10-CM | POA: Insufficient documentation

## 2020-03-03 DIAGNOSIS — Z720 Tobacco use: Secondary | ICD-10-CM | POA: Diagnosis not present

## 2020-03-03 DIAGNOSIS — E785 Hyperlipidemia, unspecified: Secondary | ICD-10-CM | POA: Diagnosis not present

## 2020-03-03 DIAGNOSIS — I10 Essential (primary) hypertension: Secondary | ICD-10-CM | POA: Diagnosis not present

## 2020-03-03 LAB — LIPID PANEL
Cholesterol: 203 mg/dL — ABNORMAL HIGH (ref 0–200)
HDL: 49.4 mg/dL (ref >39.00)
LDL Cholesterol: 132 mg/dL — ABNORMAL HIGH (ref 0–99)
NonHDL: 153.19
Total CHOL/HDL Ratio: 4
Triglycerides: 105 mg/dL (ref 0.0–149.0)
VLDL: 21 mg/dL (ref 0.0–40.0)

## 2020-03-03 LAB — COMPREHENSIVE METABOLIC PANEL
ALT: 31 U/L (ref 0–53)
AST: 28 U/L (ref 0–37)
Albumin: 4.1 g/dL (ref 3.5–5.2)
Alkaline Phosphatase: 68 U/L (ref 39–117)
BUN: 14 mg/dL (ref 6–23)
CO2: 32 mEq/L (ref 19–32)
Calcium: 9.2 mg/dL (ref 8.4–10.5)
Chloride: 103 mEq/L (ref 96–112)
Creatinine, Ser: 0.79 mg/dL (ref 0.40–1.50)
GFR: 114.96 mL/min (ref >60.00)
Glucose, Bld: 96 mg/dL (ref 70–99)
Potassium: 4.2 mEq/L (ref 3.5–5.1)
Sodium: 141 mEq/L (ref 135–145)
Total Bilirubin: 0.4 mg/dL (ref 0.2–1.2)
Total Protein: 7.3 g/dL (ref 6.0–8.3)

## 2020-03-03 LAB — CBC
HCT: 49.8 % (ref 39.0–52.0)
Hemoglobin: 17.2 g/dL — ABNORMAL HIGH (ref 13.0–17.0)
MCHC: 34.5 g/dL (ref 30.0–36.0)
MCV: 94 fl (ref 78.0–100.0)
Platelets: 233 10*3/uL (ref 150.0–400.0)
RBC: 5.3 Mil/uL (ref 4.22–5.81)
RDW: 13.5 % (ref 11.5–15.5)
WBC: 7.4 10*3/uL (ref 4.0–10.5)

## 2020-03-03 LAB — HEMOGLOBIN A1C: Hgb A1c MFr Bld: 5.4 % (ref 4.6–6.5)

## 2020-03-03 MED ORDER — CHANTIX STARTING MONTH PAK 0.5 MG X 11 & 1 MG X 42 PO TABS: ORAL_TABLET | ORAL | 0 refills | Status: DC

## 2020-03-03 MED ORDER — VARENICLINE TARTRATE 1 MG PO TABS: ORAL_TABLET | ORAL | 0 refills | Status: DC

## 2020-03-03 NOTE — Assessment & Plan Note (Signed)
Chronic and continued, Celexa somewhat helpful but then became ineffective.  Discussed the options for treatment, he would like to work on quitting smoking first which is reasonable. He will update.

## 2020-03-03 NOTE — Assessment & Plan Note (Signed)
Doing well on pantoprazole 40mg . Discussed weaning instructions, he will notify when ready.

## 2020-03-03 NOTE — Assessment & Plan Note (Signed)
Borderline but improved today, home readings seem higher but he does have anxiety when checking.  Long discussion today regarding what to do next. He would like to stop smoking first, work on lifestyle changes, continue to monitor. I agree.  If still above goal (>130/89) after he quits we will proceed with addition of amlodipine 5 mg. CMP pending today.

## 2020-03-03 NOTE — Assessment & Plan Note (Signed)
Ready to quit smoking, is now smoking 1 and 1/2 PPD.  Discussed options, he has failed OTC options.  Rx for Chantix Starer Pack sent to pharmacy. Discussed side effects, when to start, when to quit smoking.   He will notify if he requires a continuing pack.

## 2020-03-03 NOTE — Assessment & Plan Note (Signed)
Discussed the importance of a healthy diet and regular exercise in order for weight loss, and to reduce the risk of any potential medical problems.  Repeat lipids pending.

## 2020-03-03 NOTE — Telephone Encounter (Signed)
Noted, Rx for Chantix 1 mg sent as noted below.

## 2020-03-03 NOTE — Telephone Encounter (Signed)
Chantix Starter is on manufacturer Back Order  Please send new rx that reads: Chantix 1mg  take 1/2 tab by mouth for 3 days then increase 1/2 tab by mouth twice daily for 4 days then increase to 1 tab daily there after

## 2020-03-03 NOTE — Progress Notes (Signed)
Subjective:    Patient ID: Edward Day, male    DOB: 1986/03/21, 34 y.o.   MRN: [id number]  HPI  This visit occurred during the SARS-CoV-2 public health emergency.  Safety protocols were in place, including screening questions prior to the visit, additional usage of staff PPE, and extensive cleaning of exam room while observing appropriate contact time as indicated for disinfecting solutions.   Edward Day is a 34 year old male who presents today for complete physical.  Immunizations: -Tetanus: Completed in 2012 -Influenza: Declines  -Covid-19: Declines   Diet: He endorses a healthy diet.  Exercise: He is active at work.  Eye exam: Due in January 2022 Dental exam: No recent exam  BP Readings from Last 3 Encounters:  03/03/20 134/86  12/31/19 (!) 147/103  08/05/19 (!) 149/88   He's checking his blood pressure at home which is running 140's/90's but he feels anxious each time he checks. He denies headaches, fatigue, dizziness. He does smoke cigarettes, 1 and 1/2 PPD, he is ready to quit. He's tried nicotine gum and patches without success. He is interested in either Chantix or Wellbutrin.   Review of Systems  Constitutional: Negative for unexpected weight change.  HENT: Negative for rhinorrhea.   Eyes: Negative for visual disturbance.  Respiratory: Negative for cough and shortness of breath.   Cardiovascular: Negative for chest pain.  Gastrointestinal: Negative for constipation and diarrhea.  Genitourinary: Negative for difficulty urinating.  Musculoskeletal: Positive for arthralgias.  Skin: Negative for rash.  Allergic/Immunologic: Positive for environmental allergies.  Neurological: Negative for dizziness, numbness and headaches.  Hematological: Negative for adenopathy.  Psychiatric/Behavioral: The patient is nervous/anxious.        He continues to struggle with daily anxiety, finds himself snapping at his kids and family. Improvement with Celexa, but became ineffective.         Past Medical History:  Diagnosis Date   DVT (deep venous thrombosis) (HCC)    GAD (generalized anxiety disorder)    GERD (gastroesophageal reflux disease)    H. pylori infection    Hypertension    Low testosterone    Polycythemia    Pulmonary embolism (HCC)      Social History   Socioeconomic History   Marital status: Married    Spouse name: Not on file   Number of children: Not on file   Years of education: Not on file   Highest education level: Not on file  Occupational History   Not on file  Tobacco Use   Smoking status: Former Smoker    Packs/day: 0.50    Years: 15.00    Pack years: 7.50    Types: Cigarettes    Quit date: 05/24/2016    Years since quitting: 3.7   Smokeless tobacco: Former Systems developer    Types: Snuff   Tobacco comment: has cut back to one cig every 6 days 09/06/16  Vaping Use   Vaping Use: Never used  Substance and Sexual Activity   Alcohol use: Yes    Alcohol/week: 0.0 standard drinks   Drug use: No   Sexual activity: Yes    Partners: Female  Other Topics Concern   Not on file  Social History Narrative   Married.   1 child.   Works for [employer].   Enjoys going to the lake, riding motor cycles.   Social Determinants of Health   Financial Resource Strain:    Difficulty of Paying Living Expenses: Not on file  Food Insecurity:  Worried About Charity fundraiser in the Last Year: Not on file   [employer] of Food in the Last Year: Not on file  Transportation Needs:    Lack of Transportation (Medical): Not on file   Lack of Transportation (Non-Medical): Not on file  Physical Activity:    Days of Exercise per Week: Not on file   Minutes of Exercise per Session: Not on file  Stress:    Feeling of Stress : Not on file  Social Connections:    Frequency of Communication with Friends and Family: Not on file   Frequency of Social Gatherings with Friends and Family: Not on file    Attends Religious Services: Not on file   Active Member of [employer] or Organizations: Not on file   Attends Archivist Meetings: Not on file   Marital Status: Not on file  Intimate Partner Violence:    Fear of Current or Ex-Partner: Not on file   Emotionally Abused: Not on file   Physically Abused: Not on file   Sexually Abused: Not on file    Past Surgical History:  Procedure Laterality Date   APPENDECTOMY  2003   [place]  2014   FINGER SURGERY Right 2003   [place]  2009, 2013, 2013, 2014   TONSILLECTOMY      Family History  Problem Relation Age of Onset   Hypertension Mother    Polycythemia Mother    Hyperlipidemia Mother    Diabetes Father    Hypertension Father    Cancer Maternal Grandmother        breast cancer   Dementia Maternal Grandmother    Diabetes Maternal Grandmother     Allergies  Allergen Reactions   Banana Swelling and Anaphylaxis    Current Outpatient Medications on File Prior to Visit  Medication Sig Dispense Refill   losartan (COZAAR) 100 MG tablet One tab daily 90 tablet 0   pantoprazole (PROTONIX) 40 MG tablet One tab daily 90 tablet 0   No current facility-administered medications on file prior to visit.    BP 134/86    Pulse 99    Temp 97.6 F (36.4 C) (Temporal)    Ht 6\' 1"  (1.854 m)    Wt 294 lb (133.4 kg)    SpO2 96%    BMI 38.79 kg/m    Objective:   Physical Exam HENT:     Right Ear: Tympanic membrane and ear canal normal.     Left Ear: Tympanic membrane and ear canal normal.  Eyes:     Pupils: Pupils are equal, round, and reactive to light.  Cardiovascular:     Rate and Rhythm: Normal rate and regular rhythm.  Pulmonary:     Effort: Pulmonary effort is normal.     Breath sounds: Normal breath sounds.  Abdominal:     General: Bowel sounds are normal.     Palpations: Abdomen is soft.     Tenderness: There is no abdominal tenderness.  Musculoskeletal:         General: Normal range of motion.     Cervical back: Neck supple.  Skin:    General: Skin is warm and dry.  Neurological:     Mental Status: He is alert and oriented to person, place, and time.     Cranial Nerves: No cranial nerve deficit.     Deep Tendon Reflexes:     Reflex Scores:      Patellar reflexes  are 2+ on the right side and 2+ on the left side. Psychiatric:        Mood and Affect: Mood normal.            Assessment & Plan:

## 2020-03-03 NOTE — Assessment & Plan Note (Signed)
Declines influenza vaccination. Discussed the importance of a healthy diet and regular exercise in order for weight loss, and to reduce the risk of any potential medical problems. Will work on tobacco cessation.  Exam today as noted. Labs pending.

## 2020-03-03 NOTE — Patient Instructions (Addendum)
Stop by the lab prior to leaving today. I will notify you of your results once received.   Start Chantix once you have chosen a quit date. The quit date must be within 7 days. Please update me if you'd like a continuing pack sent to the pharmacy.  Start exercising. You should be getting 150 minutes of moderate intensity exercise weekly.  Continue to work on a healthy diet.   It was a pleasure to see you today!   Preventive Care 22-35 Years Old, Male Preventive care refers to lifestyle choices and visits with your health care provider that can promote health and wellness. This includes:  A yearly physical exam. This is also called an annual well check.  Regular dental and eye exams.  Immunizations.  Screening for certain conditions.  Healthy lifestyle choices, such as eating a healthy diet, getting regular exercise, not using drugs or products that contain nicotine and tobacco, and limiting alcohol use. What can I expect for my preventive care visit? Physical exam Your health care provider will check:  Height and weight. These may be used to calculate body mass index (BMI), which is a measurement that tells if you are at a healthy weight.  Heart rate and blood pressure.  Your skin for abnormal spots. Counseling Your health care provider may ask you questions about:  Alcohol, tobacco, and drug use.  Emotional well-being.  Home and relationship well-being.  Sexual activity.  Eating habits.  Work and work Statistician. What immunizations do I need?  Influenza (flu) vaccine  This is recommended every year. Tetanus, diphtheria, and pertussis (Tdap) vaccine  You may need a Td booster every 10 years. Varicella (chickenpox) vaccine  You may need this vaccine if you have not already been vaccinated. Human papillomavirus (HPV) vaccine  If recommended by your health care provider, you may need three doses over 6 months. Measles, mumps, and rubella (MMR) vaccine  You  may need at least one dose of MMR. You may also need a second dose. Meningococcal conjugate (MenACWY) vaccine  One dose is recommended if you are 25-5 years old and a Market researcher living in a residence hall, or if you have one of several medical conditions. You may also need additional booster doses. Pneumococcal conjugate (PCV13) vaccine  You may need this if you have certain conditions and were not previously vaccinated. Pneumococcal polysaccharide (PPSV23) vaccine  You may need one or two doses if you smoke cigarettes or if you have certain conditions. Hepatitis A vaccine  You may need this if you have certain conditions or if you travel or work in places where you may be exposed to hepatitis A. Hepatitis B vaccine  You may need this if you have certain conditions or if you travel or work in places where you may be exposed to hepatitis B. Haemophilus influenzae type b (Hib) vaccine  You may need this if you have certain risk factors. You may receive vaccines as individual doses or as more than one vaccine together in one shot (combination vaccines). Talk with your health care provider about the risks and benefits of combination vaccines. What tests do I need? Blood tests  Lipid and cholesterol levels. These may be checked every 5 years starting at age 53.  Hepatitis C test.  Hepatitis B test. Screening   Diabetes screening. This is done by checking your blood sugar (glucose) after you have not eaten for a while (fasting).  Sexually transmitted disease (STD) testing. Talk with your health care  provider about your test results, treatment options, and if necessary, the need for more tests. Follow these instructions at home: Eating and drinking   Eat a diet that includes fresh fruits and vegetables, whole grains, lean protein, and low-fat dairy products.  Take vitamin and mineral supplements as recommended by your health care provider.  Do not drink alcohol if  your health care provider tells you not to drink.  If you drink alcohol: ? Limit how much you have to 0-2 drinks a day. ? Be aware of how much alcohol is in your drink. In the [place] one drink equals one 12 oz bottle of beer (355 mL), one 5 oz glass of wine (148 mL), or one 1 oz glass of hard liquor (44 mL). Lifestyle  Take daily care of your teeth and gums.  Stay active. Exercise for at least 30 minutes on 5 or more days each week.  Do not use any products that contain nicotine or tobacco, such as cigarettes, e-cigarettes, and chewing tobacco. If you need help quitting, ask your health care provider.  If you are sexually active, practice safe sex. Use a condom or other form of protection to prevent STIs (sexually transmitted infections). What's next?  Go to your health care provider once a year for a well check visit.  Ask your health care provider how often you should have your eyes and teeth checked.  Stay up to date on all vaccines. This information is not intended to replace advice given to you by your health care provider. Make sure you discuss any questions you have with your health care provider. Document Revised: 03/27/2018 Document Reviewed: 03/27/2018 Elsevier Patient Education  2020 [place].          Recommended follow up: No follow-ups on file.

## 2020-04-20 ENCOUNTER — Other Ambulatory Visit: Payer: Self-pay | Admitting: Primary Care

## 2020-04-20 DIAGNOSIS — K219 Gastro-esophageal reflux disease without esophagitis: Secondary | ICD-10-CM

## 2020-04-20 DIAGNOSIS — I10 Essential (primary) hypertension: Secondary | ICD-10-CM

## 2020-04-20 NOTE — Telephone Encounter (Signed)
Last OV 03/03/20 Last fill 01/25/20  #90/0

## 2020-05-09 DIAGNOSIS — Z03818 Encounter for observation for suspected exposure to other biological agents ruled out: Secondary | ICD-10-CM | POA: Diagnosis not present

## 2020-05-09 DIAGNOSIS — Z20822 Contact with and (suspected) exposure to covid-19: Secondary | ICD-10-CM | POA: Diagnosis not present

## 2020-05-10 DIAGNOSIS — Z03818 Encounter for observation for suspected exposure to other biological agents ruled out: Secondary | ICD-10-CM | POA: Diagnosis not present

## 2020-05-10 DIAGNOSIS — Z20822 Contact with and (suspected) exposure to covid-19: Secondary | ICD-10-CM | POA: Diagnosis not present

## 2020-05-12 DIAGNOSIS — Z20822 Contact with and (suspected) exposure to covid-19: Secondary | ICD-10-CM | POA: Diagnosis not present

## 2020-05-12 DIAGNOSIS — Z03818 Encounter for observation for suspected exposure to other biological agents ruled out: Secondary | ICD-10-CM | POA: Diagnosis not present

## 2020-05-13 NOTE — Telephone Encounter (Signed)
Edward Day, will you call patient to get him set up for Saturday clinic?

## 2020-05-13 NOTE — Telephone Encounter (Signed)
Called patent appointment made

## 2020-05-14 ENCOUNTER — Encounter: Payer: Self-pay | Admitting: Family Medicine

## 2020-05-14 ENCOUNTER — Telehealth (INDEPENDENT_AMBULATORY_CARE_PROVIDER_SITE_OTHER): Payer: BC Managed Care – PPO | Admitting: Family Medicine

## 2020-05-14 VITALS — BP 140/88 | HR 90 | Wt 285.0 lb

## 2020-05-14 DIAGNOSIS — J069 Acute upper respiratory infection, unspecified: Secondary | ICD-10-CM | POA: Diagnosis not present

## 2020-05-14 MED ORDER — HYDROCOD POLST-CPM POLST ER 10-8 MG/5ML PO SUER: 5.0000 mL | Freq: Every evening | ORAL | 0 refills | Status: DC | PRN

## 2020-05-14 NOTE — Progress Notes (Signed)
I connected with Edward Day on 05/14/20 at 11:20 AM EST by video and verified that I am speaking with the correct person using two identifiers.   I discussed the limitations, risks, security and privacy concerns of performing an evaluation and management service by video and the availability of in person appointments. I also discussed with the patient that there may be a patient responsible charge related to this service. The patient expressed understanding and agreed to proceed.  Patient location: Home Provider Location: [place] Participants: Lesleigh Noe and Edward Day   Subjective:     Edward Day is a 35 y.o. male presenting for Cough (Mostly dry X 4 days. (Covid - on 05/12/20))     HPI   #Covid 73 - has been tested for covid 19 3 times with negative test - dry cough - during the day is fine - current smoker - treatment- mucinex DM, robitussin w/o improvement - initially started in sinuses - but this has improved - irritated in the middle  Symptoms started on 1/25  Wife recently had covid and had to have 2 negative tests    No hx of wheezing - has cut back on smoking significantly   Review of Systems  Constitutional: Negative for chills and fever.  HENT: Positive for congestion and sinus pressure. Negative for postnasal drip and sore throat.   Respiratory: Positive for cough (worse at night). Negative for shortness of breath and wheezing.      Social History   Tobacco Use  Smoking Status Former Smoker  . Packs/day: 0.50  . Years: 15.00  . Pack years: 7.50  . Types: Cigarettes  . Quit date: 05/24/2016  . Years since quitting: 3.9  Smokeless Tobacco Former Systems developer  . Types: Snuff  Tobacco Comment   has cut back to one cig every 6 days 09/06/16        Objective:   BP Readings from Last 3 Encounters:  05/14/20 140/88  03/03/20 134/86  12/31/19 (!) 147/103   Wt Readings from Last 3 Encounters:  05/14/20 285 lb (129.3 kg)   03/03/20 294 lb (133.4 kg)  08/05/19 (!) 330 lb 11 oz (150 kg)   BP 140/88   Pulse 90   Wt 285 lb (129.3 kg)   BMI 37.60 kg/m    Physical Exam Constitutional:      Appearance: Normal appearance. He is not ill-appearing.  HENT:     Head: Normocephalic and atraumatic.     Right Ear: External ear normal.     Left Ear: External ear normal.  Eyes:     Conjunctiva/sclera: Conjunctivae normal.  Pulmonary:     Effort: Pulmonary effort is normal. No respiratory distress.  Neurological:     Mental Status: He is alert. Mental status is at baseline.  Psychiatric:        Mood and Affect: Mood normal.        Behavior: Behavior normal.        Thought Content: Thought content normal.        Judgment: Judgment normal.             Assessment & Plan:   Problem List Items Addressed This Visit   None   Visit Diagnoses    Viral URI with cough    -  Primary   Relevant Medications   chlorpheniramine-HYDROcodone (TUSSIONEX PENNKINETIC ER) 10-8 MG/5ML SUER     Negative covid x 3  Discussed continued monitoring and retesting  if new worsening Some congestion - but mild - if worsening call back Discussed viral nature of cough and symptomatic care - cough syrup for nighttime  Call back if sob, wheezing given smoking hx - could consider albuterol inhaler   Return if symptoms worsen or fail to improve.  Lesleigh Noe, MD

## 2020-07-23 ENCOUNTER — Other Ambulatory Visit: Payer: Self-pay | Admitting: Primary Care

## 2020-07-23 DIAGNOSIS — I10 Essential (primary) hypertension: Secondary | ICD-10-CM

## 2020-10-18 ENCOUNTER — Other Ambulatory Visit: Payer: Self-pay

## 2020-10-18 ENCOUNTER — Encounter: Payer: Self-pay | Admitting: Primary Care

## 2020-10-18 ENCOUNTER — Ambulatory Visit: Payer: BC Managed Care – PPO | Admitting: Primary Care

## 2020-10-18 VITALS — BP 138/86 | HR 87 | Temp 98.4°F | Ht 73.0 in | Wt 287.8 lb

## 2020-10-18 DIAGNOSIS — M542 Cervicalgia: Secondary | ICD-10-CM | POA: Diagnosis not present

## 2020-10-18 HISTORY — DX: Cervicalgia: M54.2

## 2020-10-18 MED ORDER — CYCLOBENZAPRINE HCL 10 MG PO TABS: 10.0000 mg | ORAL_TABLET | Freq: Three times a day (TID) | ORAL | 0 refills | Status: DC | PRN

## 2020-10-18 MED ORDER — KETOROLAC TROMETHAMINE 60 MG/2ML IM SOLN
60.0000 mg | Freq: Once | INTRAMUSCULAR | Status: AC
  Administered 2020-10-18: 12:00:00 60 mg via INTRAMUSCULAR

## 2020-10-18 NOTE — Addendum Note (Signed)
Addended by: Armandina Gemma L on: 10/18/2020 11:43 AM   Modules accepted: Orders

## 2020-10-18 NOTE — Progress Notes (Signed)
Subjective:    Patient ID: Edward Day, male    DOB: March 04, 1986, 35 y.o.   MRN: [id number]  HPI  Edward Day is a very pleasant 35 y.o. male who presents today to discuss neck pain.  His pain is located to the middle lower neck and bilateral sides of his neck which began about five days ago. He does have stiffness with flexion and extension and right and left rotation. He believes he's noticed a slight improvement in range of motion today. He denies injury/trauma, over exertion, numbness/tingling.   He's been taking Ibuprofen, Aleve, and Tylenol with little improvement. He's not slept well in a few days. He had to call out of work today.     Review of Systems  Musculoskeletal:  Positive for myalgias and neck pain.  Neurological:  Negative for numbness.        Past Medical History:  Diagnosis Date   DVT (deep venous thrombosis) (HCC)    GAD (generalized anxiety disorder)    GERD (gastroesophageal reflux disease)    H. pylori infection    Hypertension    Low testosterone    Polycythemia    Pulmonary embolism (HCC)     Social History   Socioeconomic History   Marital status: Married    Spouse name: Not on file   Number of children: Not on file   Years of education: Not on file   Highest education level: Not on file  Occupational History   Not on file  Tobacco Use   Smoking status: Former    Packs/day: 0.50    Years: 15.00    Pack years: 7.50    Types: Cigarettes    Quit date: 05/24/2016    Years since quitting: 4.4   Smokeless tobacco: Former    Types: Snuff   Tobacco comments:    has cut back to one cig every 6 days 09/06/16  Vaping Use   Vaping Use: Never used  Substance and Sexual Activity   Alcohol use: Yes    Alcohol/week: 0.0 standard drinks   Drug use: No   Sexual activity: Yes    Partners: Female  Other Topics Concern   Not on file  Social History Narrative   Married.   1 child.   Works for [employer].   Enjoys  going to the lake, riding motor cycles.   Social Determinants of Health   Financial Resource Strain: Not on file  Food Insecurity: Not on file  Transportation Needs: Not on file  Physical Activity: Not on file  Stress: Not on file  Social Connections: Not on file  Intimate Partner Violence: Not on file    Past Surgical History:  Procedure Laterality Date   APPENDECTOMY  2003   [place]  2014   FINGER SURGERY Right 2003   [place]  2009, 2013, 2013, 2014   TONSILLECTOMY      Family History  Problem Relation Age of Onset   Hypertension Mother    Polycythemia Mother    Hyperlipidemia Mother    Diabetes Father    Hypertension Father    Cancer Maternal Grandmother        breast cancer   Dementia Maternal Grandmother    Diabetes Maternal Grandmother     Allergies  Allergen Reactions   Banana Swelling and Anaphylaxis    Current Outpatient Medications on File Prior to Visit  Medication Sig Dispense Refill   losartan (COZAAR)  100 MG tablet TAKE 1 TABLET BY MOUTH ONCE DAILY for blood pressure. 90 tablet 1   pantoprazole (PROTONIX) 40 MG tablet TAKE 1 TABLET BY MOUTH ONCE DAILY 90 tablet 1   chlorpheniramine-HYDROcodone (TUSSIONEX PENNKINETIC ER) 10-8 MG/5ML SUER Take 5 mLs by mouth at bedtime as needed for cough. 140 mL 0   Dextromethorphan-guaiFENesin (MUCINEX DM PO) Take 1,200 mg by mouth as needed.     No current facility-administered medications on file prior to visit.    BP 138/86   Pulse 87   Temp 98.4 F (36.9 C) (Temporal)   Ht 6\' 1"  (1.854 m)   Wt 287 lb 12.8 oz (130.5 kg)   BMI 37.97 kg/m  Objective:   Physical Exam Neck:     Comments: Decrease in ROM of neck with flexion, right and left rotation.  Muscular tenderness to base of neck and bilateral upper trapezius.  Pulmonary:     Effort: Pulmonary effort is normal.  Musculoskeletal:     Cervical back: Pain with movement and muscular tenderness present. No spinous process  tenderness. Decreased range of motion.  Skin:    General: Skin is warm and dry.  Neurological:     Mental Status: He is alert.          Assessment & Plan:      This visit occurred during the SARS-CoV-2 public health emergency.  Safety protocols were in place, including screening questions prior to the visit, additional usage of staff PPE, and extensive cleaning of exam room while observing appropriate contact time as indicated for disinfecting solutions.

## 2020-10-18 NOTE — Patient Instructions (Signed)
You may take the cyclobenzaprine 10 mg three times daily as needed for muscle tightness/pain.   You can also take Ibuprofen/Tylenol/Aleve for pain.   Try to gently stretch your neck when possible.   It was a pleasure to see you today!

## 2020-10-18 NOTE — Assessment & Plan Note (Addendum)
Acute for the last five days, no known injury. No alarm signs. Suspect MSK related given his stiffness/decrease in ROM on exam.   IM Toradol 60 mg provided today. Rx for cyclobenzaprine 10 mg provided to use PRN. Drowsiness precautions provided.   Encouraged gentle stretching daily.  Heat/Ice.

## 2020-10-29 ENCOUNTER — Other Ambulatory Visit: Payer: Self-pay | Admitting: Primary Care

## 2020-10-29 DIAGNOSIS — I10 Essential (primary) hypertension: Secondary | ICD-10-CM

## 2020-10-31 ENCOUNTER — Other Ambulatory Visit: Payer: Self-pay | Admitting: Primary Care

## 2020-10-31 DIAGNOSIS — K219 Gastro-esophageal reflux disease without esophagitis: Secondary | ICD-10-CM

## 2020-12-22 ENCOUNTER — Ambulatory Visit: Payer: BC Managed Care – PPO | Admitting: Podiatry

## 2020-12-22 ENCOUNTER — Other Ambulatory Visit: Payer: Self-pay

## 2020-12-22 ENCOUNTER — Encounter: Payer: Self-pay | Admitting: Podiatry

## 2020-12-22 DIAGNOSIS — L600 Ingrowing nail: Secondary | ICD-10-CM | POA: Diagnosis not present

## 2020-12-22 DIAGNOSIS — Q667 Congenital pes cavus, unspecified foot: Secondary | ICD-10-CM | POA: Diagnosis not present

## 2020-12-22 DIAGNOSIS — Q6671 Congenital pes cavus, right foot: Secondary | ICD-10-CM | POA: Diagnosis not present

## 2020-12-22 DIAGNOSIS — Q6672 Congenital pes cavus, left foot: Secondary | ICD-10-CM | POA: Diagnosis not present

## 2020-12-28 ENCOUNTER — Encounter: Payer: Self-pay | Admitting: Podiatry

## 2020-12-28 NOTE — Progress Notes (Signed)
Subjective:  Patient ID: Edward Day, male    DOB: 19-Sep-1985,  MRN: [id number]  Chief Complaint  Patient presents with   Ingrown Toenail    Left hallux ingrown     35 y.o. male presents with the above complaint.  Patient presents with primary complaint left hallux lateral border ingrown.  Patient states is painful to touch.  He states that it has progressed to gotten worse.  He has done some self debridement which helps some however he states that it is very painful for past few weeks there is no drainage associated with it.  He also has secondary complaint of high arch foot.  He works a lot on his foot.  He states that his foot feels tired at the end of the day.  He would like to get that evaluated and see if there is any kind of an orthotic device that he could be fitted for.  He denies any other acute complaints.   Review of Systems: Negative except as noted in the HPI. Denies N/V/F/Ch.  Past Medical History:  Diagnosis Date   DVT (deep venous thrombosis) (HCC)    GAD (generalized anxiety disorder)    GERD (gastroesophageal reflux disease)    H. pylori infection    Hypertension    Low testosterone    Polycythemia    Pulmonary embolism (HCC)     Current Outpatient Medications:    cyclobenzaprine (FLEXERIL) 10 MG tablet, Take 1 tablet (10 mg total) by mouth 3 (three) times daily as needed for muscle spasms., Disp: 15 tablet, Rfl: 0   losartan (COZAAR) 100 MG tablet, TAKE 1 TABLET BY MOUTH ONCE DAILY, Disp: 90 tablet, Rfl: 1   pantoprazole (PROTONIX) 40 MG tablet, TAKE 1 TABLET BY MOUTH ONCE A DAY for heartburn, Disp: 90 tablet, Rfl: 0  Social History   Tobacco Use  Smoking Status Former   Packs/day: 0.50   Years: 15.00   Pack years: 7.50   Types: Cigarettes   Quit date: 05/24/2016   Years since quitting: 4.6  Smokeless Tobacco Former   Types: Snuff  Tobacco Comments   has cut back to one cig every 6 days 09/06/16    Allergies  Allergen Reactions   Banana Swelling  and Anaphylaxis   Objective:  There were no vitals filed for this visit. There is no height or weight on file to calculate BMI. Constitutional Well developed. Well nourished.  Vascular Dorsalis pedis pulses palpable bilaterally. Posterior tibial pulses palpable bilaterally. Capillary refill normal to all digits.  No cyanosis or clubbing noted. Pedal hair growth normal.  Neurologic Normal speech. Oriented to person, place, and time. Epicritic sensation to light touch grossly present bilaterally.  Dermatologic Painful ingrowing nail at lateral nail borders of the hallux nail left. No other open wounds. No skin lesions.  Orthopedic: Normal joint ROM without pain or crepitus bilaterally. No visible deformities. No bony tenderness.   Radiographs: None Assessment:   1. Pes cavus   2. Ingrown left big toenail    Plan:  Patient was evaluated and treated and all questions answered.  Ingrown Nail, left -Patient elects to proceed with minor surgery to remove ingrown toenail removal today. Consent reviewed and signed by patient. -Ingrown nail excised. See procedure note. -Educated on post-procedure care including soaking. Written instructions provided and reviewed. -Patient to follow up in 2 weeks for nail check.  Pes cavus -I explained to the patient the etiology of pes cavus foot structure and various treatment options were extensively  discussed.  Given that he is very heavy on his foot with ambulation I believe patient will benefit with custom-made orthotics to help support the arches of his foot and take the pressure away from the arch and the heel to allow for support and therefore decrease his achiness.  Patient states understand would like to proceed with orthotics -He was casted for custom orthotics  Procedure: Excision of Ingrown Toenail Location: Left 1st toe lateral nail borders. Anesthesia: Lidocaine 1% plain; 1.5 mL and Marcaine 0.5% plain; 1.5 mL, digital block. Skin  Prep: Betadine. Dressing: Silvadene; telfa; dry, sterile, compression dressing. Technique: Following skin prep, the toe was exsanguinated and a tourniquet was secured at the base of the toe. The affected nail border was freed, split with a nail splitter, and excised. Chemical matrixectomy was then performed with phenol and irrigated out with alcohol. The tourniquet was then removed and sterile dressing applied. Disposition: Patient tolerated procedure well. Patient to return in 2 weeks for follow-up.   No follow-ups on file.

## 2021-01-23 ENCOUNTER — Ambulatory Visit (INDEPENDENT_AMBULATORY_CARE_PROVIDER_SITE_OTHER): Payer: BC Managed Care – PPO | Admitting: Podiatry

## 2021-01-23 ENCOUNTER — Other Ambulatory Visit: Payer: Self-pay

## 2021-01-23 DIAGNOSIS — Q667 Congenital pes cavus, unspecified foot: Secondary | ICD-10-CM

## 2021-01-23 NOTE — Patient Instructions (Signed)

## 2021-01-23 NOTE — Progress Notes (Signed)
Patient presents to pick up orthotics.  I ensured proper fit.  Instructions were given.  I will see him back in four weeks.

## 2021-01-25 ENCOUNTER — Other Ambulatory Visit: Payer: Self-pay | Admitting: Primary Care

## 2021-01-25 DIAGNOSIS — K219 Gastro-esophageal reflux disease without esophagitis: Secondary | ICD-10-CM

## 2021-03-02 ENCOUNTER — Ambulatory Visit: Payer: BC Managed Care – PPO | Admitting: Podiatry

## 2021-03-06 ENCOUNTER — Other Ambulatory Visit: Payer: Self-pay | Admitting: Primary Care

## 2021-03-06 DIAGNOSIS — K219 Gastro-esophageal reflux disease without esophagitis: Secondary | ICD-10-CM

## 2021-07-26 ENCOUNTER — Other Ambulatory Visit: Payer: Self-pay | Admitting: Primary Care

## 2021-07-26 DIAGNOSIS — I10 Essential (primary) hypertension: Secondary | ICD-10-CM

## 2021-07-26 NOTE — Telephone Encounter (Signed)
Left message to return call to our office.  

## 2021-07-26 NOTE — Telephone Encounter (Signed)
Patient is overdue for CPE/labs/follow-up. ? ?Please schedule follow-up for CPE ASAP. ?Let me know when this has been scheduled. ?

## 2021-07-27 NOTE — Telephone Encounter (Signed)
I have called patient made cpe for 4/20. He will not need refill before appointment. Will come fasting for 4 hours.  ?

## 2021-08-03 ENCOUNTER — Encounter: Payer: Self-pay | Admitting: Primary Care

## 2021-08-03 ENCOUNTER — Ambulatory Visit: Payer: BC Managed Care – PPO | Admitting: Primary Care

## 2021-08-03 VITALS — BP 148/90 | HR 100 | Temp 97.9°F | Ht 73.0 in | Wt 315.4 lb

## 2021-08-03 DIAGNOSIS — F411 Generalized anxiety disorder: Secondary | ICD-10-CM

## 2021-08-03 DIAGNOSIS — E785 Hyperlipidemia, unspecified: Secondary | ICD-10-CM | POA: Diagnosis not present

## 2021-08-03 DIAGNOSIS — Z23 Encounter for immunization: Secondary | ICD-10-CM

## 2021-08-03 DIAGNOSIS — R7303 Prediabetes: Secondary | ICD-10-CM

## 2021-08-03 DIAGNOSIS — Z0000 Encounter for general adult medical examination without abnormal findings: Secondary | ICD-10-CM | POA: Diagnosis not present

## 2021-08-03 DIAGNOSIS — K219 Gastro-esophageal reflux disease without esophagitis: Secondary | ICD-10-CM | POA: Diagnosis not present

## 2021-08-03 DIAGNOSIS — M2550 Pain in unspecified joint: Secondary | ICD-10-CM

## 2021-08-03 DIAGNOSIS — G8929 Other chronic pain: Secondary | ICD-10-CM

## 2021-08-03 DIAGNOSIS — I10 Essential (primary) hypertension: Secondary | ICD-10-CM

## 2021-08-03 MED ORDER — LOSARTAN POTASSIUM 100 MG PO TABS: ORAL_TABLET | ORAL | 3 refills | Status: DC

## 2021-08-03 NOTE — Assessment & Plan Note (Addendum)
To knees, ankles, back. ? ?History of 3 right knee surgeries. ? ?Advised that he alternate between ibuprofen and Tylenol. ?Strongly advise he work on weight loss. ? 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  He will update once he recalls the name. ? ?Continue to monitor.  ?

## 2021-08-03 NOTE — Progress Notes (Signed)
? ?Subjective:  ? ? Patient ID: Edward Day, male    DOB: 1986/03/04, 36 y.o.   MRN: [id number] ? ?HPI ? ?Edward Day is a very pleasant 36 y.o. male who presents today for complete physical and follow up of chronic conditions. ? ? ?Immunizations: ?-Tetanus: 2012, due today ?-Influenza: Did not complete last season  ?-Covid-19: 2 vaccines ? ?Diet: Fair diet.  ?Exercise: No regular exercise. Active at work.  ? ?Eye exam: Completes annually  ?Dental exam: Completed several years ago.  ? ?Wt Readings from Last 3 Encounters:  ?08/03/21 (!) 315 lb 6.4 oz (143.1 kg)  ?10/18/20 287 lb 12.8 oz (130.5 kg)  ?05/14/20 285 lb (129.3 kg)  ? ? ? ?BP Readings from Last 3 Encounters:  ?08/03/21 (!) 148/90  ?10/18/20 138/86  ?05/14/20 140/88  ? ?He checks his BP at home once weekly which runs 130's/80's.  ? ?Review of Systems  ?Constitutional:  Negative for unexpected weight change.  ?HENT:  Negative for rhinorrhea.   ?Respiratory:  Negative for cough and shortness of breath.   ?Cardiovascular:  Negative for chest pain.  ?Gastrointestinal:  Negative for constipation and diarrhea.  ?Genitourinary:  Negative for difficulty urinating.  ?Musculoskeletal:  Positive for arthralgias.  ?Skin:  Negative for rash.  ?Allergic/Immunologic: Negative for environmental allergies.  ?Neurological:  Negative for dizziness and headaches.  ?Psychiatric/Behavioral:  The patient is not nervous/anxious.   ? ?   ? ? ?Past Medical History:  ?Diagnosis Date  ? Chest pain 11/11/2013  ? DVT (deep venous thrombosis) ([place])   ? Facial cellulitis 03/07/2018  ? GAD (generalized anxiety disorder)   ? GERD (gastroesophageal reflux disease)   ? Edward Day infection   ? Hypertension   ? Low testosterone   ? Polycythemia   ? Pulmonary embolism ([place])   ? ? ?Social History  ? ?Socioeconomic History  ? Marital status: Married  ?  Spouse name: Not on file  ? Number of children: Not on file  ? Years of education: Not on file  ? Highest education level: Not on file   ?Occupational History  ? Not on file  ?Tobacco Use  ? Smoking status: Former  ?  Packs/day: 1.00  ?  Years: 15.00  ?  Pack years: 15.00  ?  Types: Cigarettes  ?  Quit date: 05/24/2016  ?  Years since quitting: 5.1  ? Smokeless tobacco: Former  ?  Types: Snuff  ? Tobacco comments:  ?  1 pack a day 01/23/2021  ?Vaping Use  ? Vaping Use: Never used  ?Substance and Sexual Activity  ? Alcohol use: Yes  ?  Comment: occasionally  ? Drug use: No  ? Sexual activity: Yes  ?  Partners: Female  ?Other Topics Concern  ? Not on file  ?Social History Narrative  ? Married.  ? 1 child.  ? Works for [employer].  ? [place], [place] motor cycles.  ? ?Social Determinants of Health  ? ?Financial Resource Strain: Not on file  ?Food Insecurity: Not on file  ?Transportation Needs: Not on file  ?Physical Activity: Not on file  ?Stress: Not on file  ?Social Connections: Not on file  ?Intimate Partner Violence: Not on file  ? ? ?Past Surgical History:  ?Procedure Laterality Date  ? APPENDECTOMY  2003  ? ELBOW SURGERY  2014  ? FINGER SURGERY Right 2003  ? KNEE SURGERY    ? SHOULDER SURGERY  2009, 2013, 2013, 2014  ?  TONSILLECTOMY    ? ? ?Family History  ?Problem Relation Age of Onset  ? Hypertension Mother   ? Polycythemia Mother   ? Hyperlipidemia Mother   ? Diabetes Father   ? Hypertension Father   ? Cancer Maternal Grandmother   ?     breast cancer  ? Dementia Maternal Grandmother   ? Diabetes Maternal Grandmother   ? ? ?Allergies  ?Allergen Reactions  ? Banana Swelling and Anaphylaxis  ? ? ?Current Outpatient Medications on File Prior to Visit  ?Medication Sig Dispense Refill  ? losartan (COZAAR) 100 MG tablet TAKE 1 TABLET BY MOUTH ONCE DAILY 90 tablet 1  ? pantoprazole (PROTONIX) 40 MG tablet TAKE 1 TABLET BY MOUTH ONCE A DAY for heartburn. Office visit required for further refills. (Patient not taking: Reported on 08/03/2021) 30 tablet 0  ? ?No current facility-administered medications on file  prior to visit.  ? ? ?BP (!) 148/90 (BP Location: Left Arm, Patient Position: Sitting, Cuff Size: Large)   Pulse 100   Temp 97.9 ?F (36.6 ?C) (Oral)   Ht '6\' 1"'$  (1.854 m)   Wt (!) 315 lb 6.4 oz (143.1 kg)   SpO2 96%   BMI 41.61 kg/m?  ?Objective:  ? Physical Exam ?HENT:  ?   Right Ear: Tympanic membrane and ear canal normal.  ?   Left Ear: Tympanic membrane and ear canal normal.  ?   Nose: Nose normal.  ?   Right Sinus: No maxillary sinus tenderness or frontal sinus tenderness.  ?   Left Sinus: No maxillary sinus tenderness or frontal sinus tenderness.  ?Eyes:  ?   Conjunctiva/sclera: Conjunctivae normal.  ?Neck:  ?   Thyroid: No thyromegaly.  ?   Vascular: No carotid bruit.  ?Cardiovascular:  ?   Rate and Rhythm: Normal rate and regular rhythm.  ?   Heart sounds: Normal heart sounds.  ?Pulmonary:  ?   Effort: Pulmonary effort is normal.  ?   Breath sounds: Normal breath sounds. No wheezing or rales.  ?Abdominal:  ?   General: Bowel sounds are normal.  ?   Palpations: Abdomen is soft.  ?   Tenderness: There is no abdominal tenderness.  ?Musculoskeletal:     ?   General: Normal range of motion.  ?   Cervical back: Neck supple.  ?Skin: ?   General: Skin is warm and dry.  ?Neurological:  ?   Mental Status: He is alert and oriented to person, place, and time.  ?   Cranial Nerves: No cranial nerve deficit.  ?   Deep Tendon Reflexes: Reflexes are normal and symmetric.  ?Psychiatric:     ?   Mood and Affect: Mood normal.  ? ? ? ? ? ?   ?Assessment & Plan:  ? ? ? ? ?This visit occurred during the SARS-CoV-2 public health emergency.  Safety protocols were in place, including screening questions prior to the visit, additional usage of staff PPE, and extensive cleaning of exam room while observing appropriate contact time as indicated for disinfecting solutions.  ?

## 2021-08-03 NOTE — Assessment & Plan Note (Signed)
Controlled overall.  ? ?Did not like medication.  ?No concerns today.  ? ?Continue to monitor.  ?

## 2021-08-03 NOTE — Assessment & Plan Note (Signed)
Repeat lipid panel pending.  Discussed the importance of a healthy diet and regular exercise in order for weight loss, and to reduce the risk of further co-morbidity.  

## 2021-08-03 NOTE — Assessment & Plan Note (Signed)
Above goal today, even on recheck. ? ?Home readings are better.  He will also work on lifestyle changes. ? ?Discussed notify me if he consistently sees readings at or above 135/90. ? ?CMP pending. ?Continue losartan 100 mg daily. ?

## 2021-08-03 NOTE — Patient Instructions (Addendum)
Stop by the lab prior to leaving today. I will notify you of your results once received.  ? ?Consider trying famotidine (Pepcid) 20 mg every night at bedtime for heartburn. ? ?Continue to monitor your blood pressure at home, notify me if you consistently see readings at or above 135 on top and/or 90 on bottom. ? ?It was a pleasure to see you today! ? ?Preventive Care 28-37 Years Old, Male ?Preventive care refers to lifestyle choices and visits with your health care provider that can promote health and wellness. Preventive care visits are also called wellness exams. ?What can I expect for my preventive care visit? ?Counseling ?During your preventive care visit, your health care provider may ask about your: ?Medical history, including: ?Past medical problems. ?Family medical history. ?Current health, including: ?Emotional well-being. ?Home life and relationship well-being. ?Sexual activity. ?Lifestyle, including: ?Alcohol, nicotine or tobacco, and drug use. ?Access to firearms. ?Diet, exercise, and sleep habits. ?Safety issues such as seatbelt and bike helmet use. ?Sunscreen use. ?Work and work Statistician. ?Physical exam ?Your health care provider may check your: ?Height and weight. These may be used to calculate your BMI (body mass index). BMI is a measurement that tells if you are at a healthy weight. ?Waist circumference. This measures the distance around your waistline. This measurement also tells if you are at a healthy weight and may help predict your risk of certain diseases, such as type 2 diabetes and high blood pressure. ?Heart rate and blood pressure. ?Body temperature. ? ?Vaccines are usually given at various ages, according to a schedule. Your health care provider will recommend vaccines for you based on your age, medical history, and lifestyle or other factors, such as travel or where you work. ?What tests do I need? ?Screening ?Your health care provider may recommend screening tests for certain  conditions. This may include: ?Lipid and cholesterol levels. ?Diabetes screening. This is done by checking your blood sugar (glucose) after you have not eaten for a while (fasting). ?Hepatitis B test. ?Hepatitis C test. ?HIV (human immunodeficiency virus) test. ?STI (sexually transmitted infection) testing, if you are at risk. ?Talk with your health care provider about your test results, treatment options, and if necessary, the need for more tests. ?Follow these instructions at home: ?Eating and drinking ? ?Eat a healthy diet that includes fresh fruits and vegetables, whole grains, lean protein, and low-fat dairy products. ?Drink enough fluid to keep your urine pale yellow. ?Take vitamin and mineral supplements as recommended by your health care provider. ?Do not drink alcohol if your health care provider tells you not to drink. ?If you drink alcohol: ?Limit how much you have to 0-2 drinks a day. ?Know how much alcohol is in your drink. In the [place] one drink equals one 12 oz bottle of beer (355 mL), one 5 oz glass of wine (148 mL), or one 1? oz glass of hard liquor (44 mL). ?Lifestyle ?Brush your teeth every morning and night with fluoride toothpaste. Floss one time each day. ?Exercise for at least 30 minutes 5 or more days each week. ?Do not use any products that contain nicotine or tobacco. These products include cigarettes, chewing tobacco, and vaping devices, such as e-cigarettes. If you need help quitting, ask your health care provider. ?Do not use drugs. ?If you are sexually active, practice safe sex. Use a condom or other form of protection to prevent STIs. ?Find healthy ways to manage stress, such as: ?Meditation, yoga, or listening to music. ?Journaling. ?Talking to a trusted  person. ?Spending time with friends and family. ?Minimize exposure to UV radiation to reduce your risk of skin cancer. ?Safety ?Always wear your seat belt while driving or riding in a vehicle. ?Do not drive: ?If you have been drinking  alcohol. Do not ride with someone who has been drinking. ?If you have been using any mind-altering substances or drugs. ?While texting. ?When you are tired or distracted. ?Wear a helmet and other protective equipment during sports activities. ?If you have firearms in your house, make sure you follow all gun safety procedures. ?Seek help if you have been physically or sexually abused. ?What's next? ?Go to your health care provider once a year for an annual wellness visit. ?Ask your health care provider how often you should have your eyes and teeth checked. ?Stay up to date on all vaccines. ?This information is not intended to replace advice given to you by your health care provider. Make sure you discuss any questions you have with your health care provider. ?Document Revised: 09/28/2020 Document Reviewed: 09/28/2020 ?Elsevier Patient Education ? [employer]. ? ?

## 2021-08-03 NOTE — Assessment & Plan Note (Signed)
History of. ? ?Repeat A1c pending. ? ?Long discussion about the need to improve his diet and start exercising. ? ? ?

## 2021-08-03 NOTE — Assessment & Plan Note (Signed)
Controlled with diet. ? ?Continue Tums as needed. ?Consider famotidine 20 mg if warranted.   ?

## 2021-08-03 NOTE — Assessment & Plan Note (Signed)
Tetanus due, provided today. ? ?Discussed the importance of a healthy diet and regular exercise in order for weight loss, and to reduce the risk of further co-morbidity. ? ?Exam today stable. ?Labs pending. ?

## 2021-08-04 LAB — LIPID PANEL
Cholesterol: 232 mg/dL — ABNORMAL HIGH (ref 0–200)
HDL: 51.7 mg/dL (ref >39.00)
NonHDL: 180.04
Total CHOL/HDL Ratio: 4
Triglycerides: 228 mg/dL — ABNORMAL HIGH (ref 0.0–149.0)
VLDL: 45.6 mg/dL — ABNORMAL HIGH (ref 0.0–40.0)

## 2021-08-04 LAB — COMPREHENSIVE METABOLIC PANEL
ALT: 40 U/L (ref 0–53)
AST: 35 U/L (ref 0–37)
Albumin: 4.2 g/dL (ref 3.5–5.2)
Alkaline Phosphatase: 61 U/L (ref 39–117)
BUN: 13 mg/dL (ref 6–23)
CO2: 28 mEq/L (ref 19–32)
Calcium: 9.3 mg/dL (ref 8.4–10.5)
Chloride: 99 mEq/L (ref 96–112)
Creatinine, Ser: 0.76 mg/dL (ref 0.40–1.50)
GFR: 115.16 mL/min (ref >60.00)
Glucose, Bld: 81 mg/dL (ref 70–99)
Potassium: 4 mEq/L (ref 3.5–5.1)
Sodium: 137 mEq/L (ref 135–145)
Total Bilirubin: 0.6 mg/dL (ref 0.2–1.2)
Total Protein: 7 g/dL (ref 6.0–8.3)

## 2021-08-04 LAB — CBC
HCT: 51.2 % (ref 39.0–52.0)
Hemoglobin: 17.5 g/dL — ABNORMAL HIGH (ref 13.0–17.0)
MCHC: 34.3 g/dL (ref 30.0–36.0)
MCV: 96.3 fl (ref 78.0–100.0)
Platelets: 232 10*3/uL (ref 150.0–400.0)
RBC: 5.31 Mil/uL (ref 4.22–5.81)
RDW: 13.4 % (ref 11.5–15.5)
WBC: 11.5 10*3/uL — ABNORMAL HIGH (ref 4.0–10.5)

## 2021-08-04 LAB — HEMOGLOBIN A1C: Hgb A1c MFr Bld: 5.8 % (ref 4.6–6.5)

## 2021-08-04 LAB — TSH: TSH: 1.11 u[IU]/mL (ref 0.35–5.50)

## 2021-08-04 LAB — LDL CHOLESTEROL, DIRECT: Direct LDL: 166 mg/dL

## 2021-09-15 ENCOUNTER — Encounter: Payer: Self-pay | Admitting: Family

## 2021-09-15 ENCOUNTER — Ambulatory Visit: Payer: BC Managed Care – PPO | Admitting: Family

## 2021-09-15 VITALS — BP 152/92 | HR 97 | Temp 97.8°F | Resp 16 | Ht 73.0 in | Wt 304.2 lb

## 2021-09-15 DIAGNOSIS — H6690 Otitis media, unspecified, unspecified ear: Secondary | ICD-10-CM | POA: Insufficient documentation

## 2021-09-15 DIAGNOSIS — R051 Acute cough: Secondary | ICD-10-CM | POA: Insufficient documentation

## 2021-09-15 DIAGNOSIS — R062 Wheezing: Secondary | ICD-10-CM | POA: Insufficient documentation

## 2021-09-15 DIAGNOSIS — H65192 Other acute nonsuppurative otitis media, left ear: Secondary | ICD-10-CM

## 2021-09-15 DIAGNOSIS — Z720 Tobacco use: Secondary | ICD-10-CM

## 2021-09-15 MED ORDER — ALBUTEROL SULFATE (2.5 MG/3ML) 0.083% IN NEBU: 2.5000 mg | INHALATION_SOLUTION | Freq: Four times a day (QID) | RESPIRATORY_TRACT | 1 refills | Status: DC | PRN

## 2021-09-15 MED ORDER — ALBUTEROL SULFATE (2.5 MG/3ML) 0.083% IN NEBU: 2.5000 mg | INHALATION_SOLUTION | Freq: Once | RESPIRATORY_TRACT | Status: AC

## 2021-09-15 MED ORDER — BENZONATATE 200 MG PO CAPS: 200.0000 mg | ORAL_CAPSULE | Freq: Two times a day (BID) | ORAL | 0 refills | Status: DC | PRN

## 2021-09-15 MED ORDER — AMOXICILLIN-POT CLAVULANATE 875-125 MG PO TABS: 1.0000 | ORAL_TABLET | Freq: Two times a day (BID) | ORAL | 0 refills | Status: DC

## 2021-09-15 MED ORDER — PREDNISONE 20 MG PO TABS: ORAL_TABLET | ORAL | 0 refills | Status: DC

## 2021-09-15 NOTE — Progress Notes (Signed)
Established Patient Office Visit  Subjective:  Patient ID: Edward Day, male    DOB: 20-Mar-1986  Age: 36 y.o. MRN: [id number]  CC:  Chief Complaint  Patient presents with   Cough    X 3 weeks     HPI CLARA SMOLEN is here today with concerns.   For the last three weeks  Did a video visit and was given a zpack. Helped maybe 2-3 days and then still with symptoms.  Today with symptoms sinus pressure, chest congestion, bil ear pain, and sore throat from drainage and coughing.   No fever no chills.  Does feel like he is wheezing at times.  Having a coughing fit earlier and was able to get some green sputum up which was the first time in a bit.   Past Medical History:  Diagnosis Date   Chest pain 11/11/2013   DVT (deep venous thrombosis) (HCC)    Facial cellulitis 03/07/2018   GAD (generalized anxiety disorder)    GERD (gastroesophageal reflux disease)    H. pylori infection    Hypertension    Low testosterone    Plant irritant contact dermatitis 02/28/2018   Polycythemia    Pulmonary embolism [facility])     Past Surgical History:  Procedure Laterality Date   APPENDECTOMY  2003   ELBOW SURGERY  2014   FINGER SURGERY Right 2003   KNEE SURGERY     SHOULDER SURGERY  2009, 2013, 2013, 2014   TONSILLECTOMY      Family History  Problem Relation Age of Onset   Hypertension Mother    Polycythemia Mother    Hyperlipidemia Mother    Diabetes Father    Hypertension Father    Cancer Maternal Grandmother        breast cancer   Dementia Maternal Grandmother    Diabetes Maternal Grandmother     Social History   Socioeconomic History   Marital status: Married    Spouse name: Not on file   Number of children: Not on file   Years of education: Not on file   Highest education level: Not on file  Occupational History   Not on file  Tobacco Use   Smoking status: Former    Packs/day: 1.00    Years: 15.00    Pack years: 15.00    Types: Cigarettes    Quit date:  05/24/2016    Years since quitting: 5.3   Smokeless tobacco: Former    Types: Snuff   Tobacco comments:    1 pack a day 01/23/2021  Vaping Use   Vaping Use: Never used  Substance and Sexual Activity   Alcohol use: Yes    Comment: occasionally   Drug use: No   Sexual activity: Yes    Partners: Female  Other Topics Concern   Not on file  Social History Narrative   Married.   1 child.   Works for [employer].   [place], [place] cycles.   Social Determinants of Health   Financial Resource Strain: Not on file  Food Insecurity: Not on file  Transportation Needs: Not on file  Physical Activity: Not on file  Stress: Not on file  Social Connections: Not on file  Intimate Partner Violence: Not on file    Outpatient Medications Prior to Visit  Medication Sig Dispense Refill   losartan (COZAAR) 100 MG tablet TAKE 1 TABLET BY MOUTH ONCE DAILY for blood pressure. 90 tablet 3  No facility-administered medications prior to visit.    Allergies  Allergen Reactions   Banana Swelling and Anaphylaxis        Objective:    Physical Exam Constitutional:      General: He is awake. He is not in acute distress.    Appearance: Normal appearance. He is not ill-appearing.  HENT:     Right Ear: Hearing, tympanic membrane, ear canal and external ear normal.     Left Ear: Hearing normal. Tympanic membrane is erythematous and bulging.     Nose: Nose normal.     Right Turbinates: Not enlarged or swollen.     Left Turbinates: Not enlarged or swollen.     Right Sinus: No maxillary sinus tenderness or frontal sinus tenderness.     Left Sinus: No maxillary sinus tenderness or frontal sinus tenderness.     Mouth/Throat:     Mouth: Mucous membranes are moist.     Tongue: No lesions.     Pharynx: Posterior oropharyngeal erythema present. No pharyngeal swelling or oropharyngeal exudate.     Tonsils: 1+ on the right. 1+ on the left.  Eyes:      Extraocular Movements: Extraocular movements intact.     Pupils: Pupils are equal, round, and reactive to light.  Cardiovascular:     Rate and Rhythm: Normal rate and regular rhythm.  Pulmonary:     Effort: Pulmonary effort is normal.     Breath sounds: Examination of the right-upper field reveals wheezing. Examination of the right-middle field reveals wheezing. Examination of the right-lower field reveals wheezing. Wheezing present. No decreased breath sounds, rhonchi or rales.  Neurological:     Mental Status: He is alert.    BP (!) 152/92   Pulse 97   Temp 97.8 F (36.6 C)   Resp 16   Ht '6\' 1"'$  (1.854 m)   Wt (!) 304 lb 4 oz (138 kg)   SpO2 93%   BMI 40.14 kg/m  Wt Readings from Last 3 Encounters:  09/15/21 (!) 304 lb 4 oz (138 kg)  08/03/21 (!) 315 lb 6.4 oz (143.1 kg)  10/18/20 287 lb 12.8 oz (130.5 kg)     Health Maintenance Due  Topic Date Due   COVID-19 Vaccine (3 - Booster for Moderna series) 08/02/2020    There are no preventive care reminders to display for this patient.  Lab Results  Component Value Date   TSH 1.11 08/03/2021   Lab Results  Component Value Date   WBC 11.5 (H) 08/03/2021   HGB 17.5 (H) 08/03/2021   HCT 51.2 08/03/2021   MCV 96.3 08/03/2021   PLT 232.0 08/03/2021   Lab Results  Component Value Date   NA 137 08/03/2021   K 4.0 08/03/2021   CO2 28 08/03/2021   GLUCOSE 81 08/03/2021   BUN 13 08/03/2021   CREATININE 0.76 08/03/2021   BILITOT 0.6 08/03/2021   ALKPHOS 61 08/03/2021   AST 35 08/03/2021   ALT 40 08/03/2021   PROT 7.0 08/03/2021   ALBUMIN 4.2 08/03/2021   CALCIUM 9.3 08/03/2021   ANIONGAP 14 03/08/2018   GFR 115.16 08/03/2021   Lab Results  Component Value Date   HGBA1C 5.8 08/03/2021      Assessment & Plan:   Problem List Items Addressed This Visit       Nervous and Auditory   Acute otitis media    Take antibiotic as prescribed. Increase oral fluids. Pt to f/u if sx worsen and or fail to improve  in 2-3  days. rx augmentin 875/125 mg po bid x 10 days        Relevant Medications   amoxicillin-clavulanate (AUGMENTIN) 875-125 MG tablet     Other   Tobacco abuse    Smoking cessation instruction/counseling given:  counseled patient on the dangers of tobacco use, advised patient to stop smoking, and reviewed strategies to maximize success  Pt may consider wellbutrin in the future        Wheezing on right side of chest on exhalation - Primary    Nebulizer in office today with improvement post nebulizer No wheezing on either side after nebulizer treatment  rx albuterol 0.083% nebulizer for home pt with nebulizer at home  rx prednisone 20 mg taper        Relevant Medications   albuterol (PROVENTIL) (2.5 MG/3ML) 0.083% nebulizer solution 2.5 mg   amoxicillin-clavulanate (AUGMENTIN) 875-125 MG tablet   predniSONE (DELTASONE) 20 MG tablet   albuterol (PROVENTIL) (2.5 MG/3ML) 0.083% nebulizer solution   Acute cough    rx tessalon perrles 200 mg tid prn cough        Relevant Medications   benzonatate (TESSALON) 200 MG capsule    Meds ordered this encounter  Medications   albuterol (PROVENTIL) (2.5 MG/3ML) 0.083% nebulizer solution 2.5 mg   amoxicillin-clavulanate (AUGMENTIN) 875-125 MG tablet    Sig: Take 1 tablet by mouth 2 (two) times daily.    Dispense:  20 tablet    Refill:  0    Order Specific Question:   Supervising Provider    Answer:   BEDSOLE, AMY E [[id number]]   predniSONE (DELTASONE) 20 MG tablet    Sig: Take two tablets daily for 3 days followed by one tablet daily for 4 days    Dispense:  10 tablet    Refill:  0    Order Specific Question:   Supervising Provider    Answer:   BEDSOLE, AMY E [[id number]]   albuterol (PROVENTIL) (2.5 MG/3ML) 0.083% nebulizer solution    Sig: Take 3 mLs (2.5 mg total) by nebulization every 6 (six) hours as needed for wheezing or shortness of breath.    Dispense:  150 mL    Refill:  1    Order Specific Question:   Supervising Provider     Answer:   BEDSOLE, AMY E [[id number]]   benzonatate (TESSALON) 200 MG capsule    Sig: Take 1 capsule (200 mg total) by mouth 2 (two) times daily as needed for cough.    Dispense:  20 capsule    Refill:  0    Order Specific Question:   Supervising Provider    Answer:   BEDSOLE, AMY E [[id number]]    Follow-up: Return if symptoms worsen or fail to improve with pcp.    Eugenia Pancoast, FNP

## 2021-09-15 NOTE — Assessment & Plan Note (Signed)
Smoking cessation instruction/counseling given:  counseled patient on the dangers of tobacco use, advised patient to stop smoking, and reviewed strategies to maximize success  Pt may consider wellbutrin in the future

## 2021-09-15 NOTE — Patient Instructions (Signed)
Antibiotic sent to preferred pharmacy.   Please increase oral fluids, steamy hot shower/humidifier prn.  Please follow up if no improvement in 2-3 days.   It was a pleasure seeing you today! Please do not hesitate to reach out with any questions and or concerns.  Regards,   Kanton Kamel   

## 2021-09-15 NOTE — Assessment & Plan Note (Signed)
Nebulizer in office today with improvement post nebulizer No wheezing on either side after nebulizer treatment  rx albuterol 0.083% nebulizer for home pt with nebulizer at home  rx prednisone 20 mg taper

## 2021-09-15 NOTE — Assessment & Plan Note (Signed)
Take antibiotic as prescribed. Increase oral fluids. Pt to f/u if sx worsen and or fail to improve in 2-3 days. rx augmentin 875/125 mg po bid x 10 days  

## 2021-09-15 NOTE — Assessment & Plan Note (Signed)
rx tessalon perrles 200 mg tid prn cough

## 2021-12-19 IMAGING — DX DG LUMBAR SPINE COMPLETE 4+V
5 series · 5 of 5 positions shown · non-contrast
Comparison: None.

CLINICAL DATA: Back pain after fall

EXAM:
LUMBAR SPINE - COMPLETE 4+ VIEW

[l-spine ap]
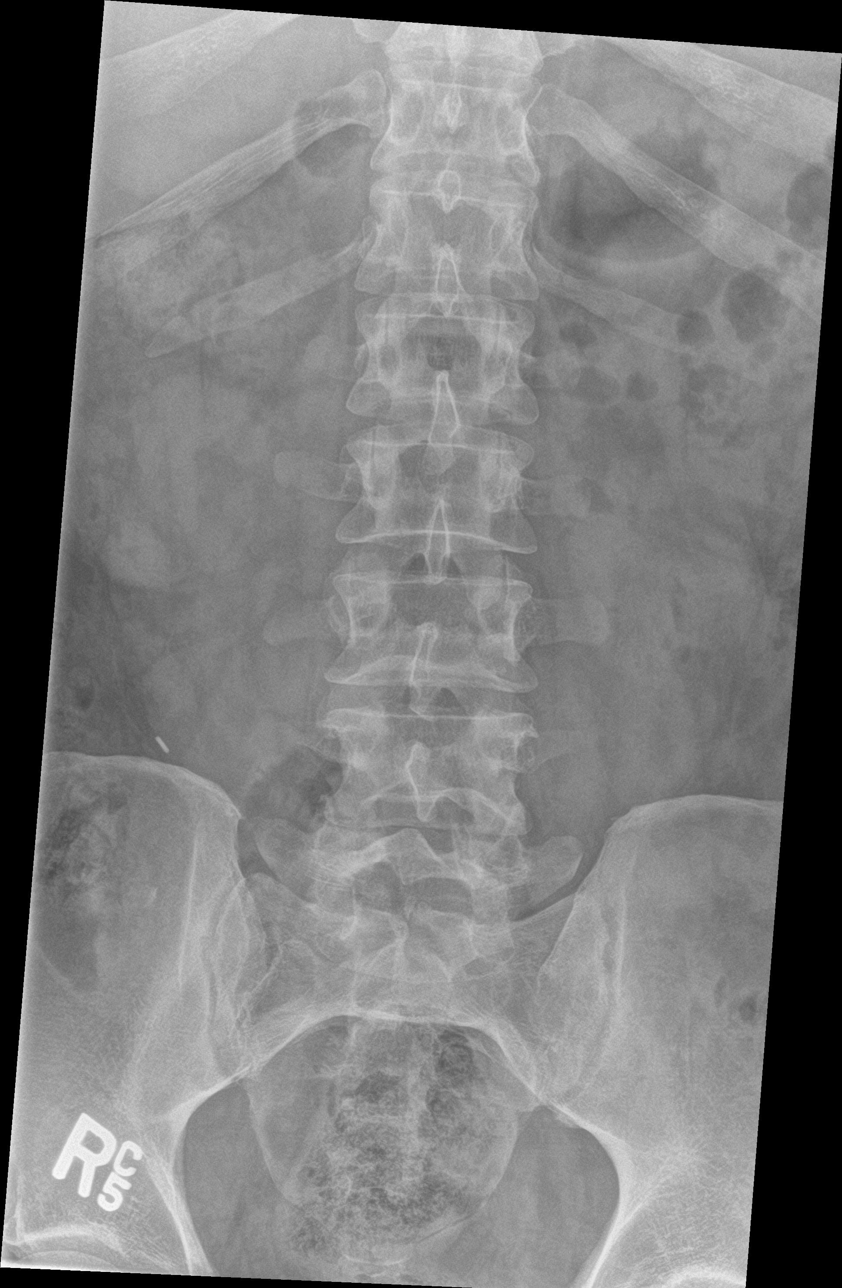

[l-spine obl (1 of 2)]
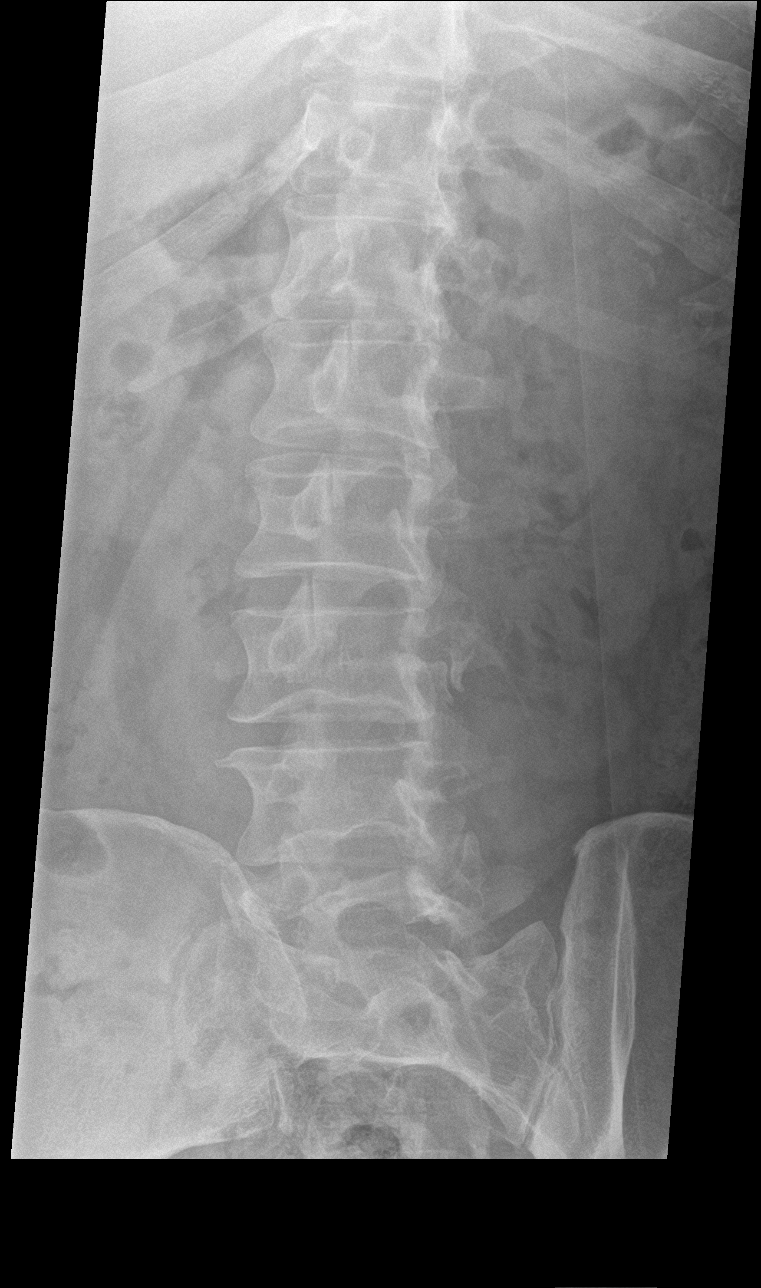

[l-spine obl (2 of 2)]
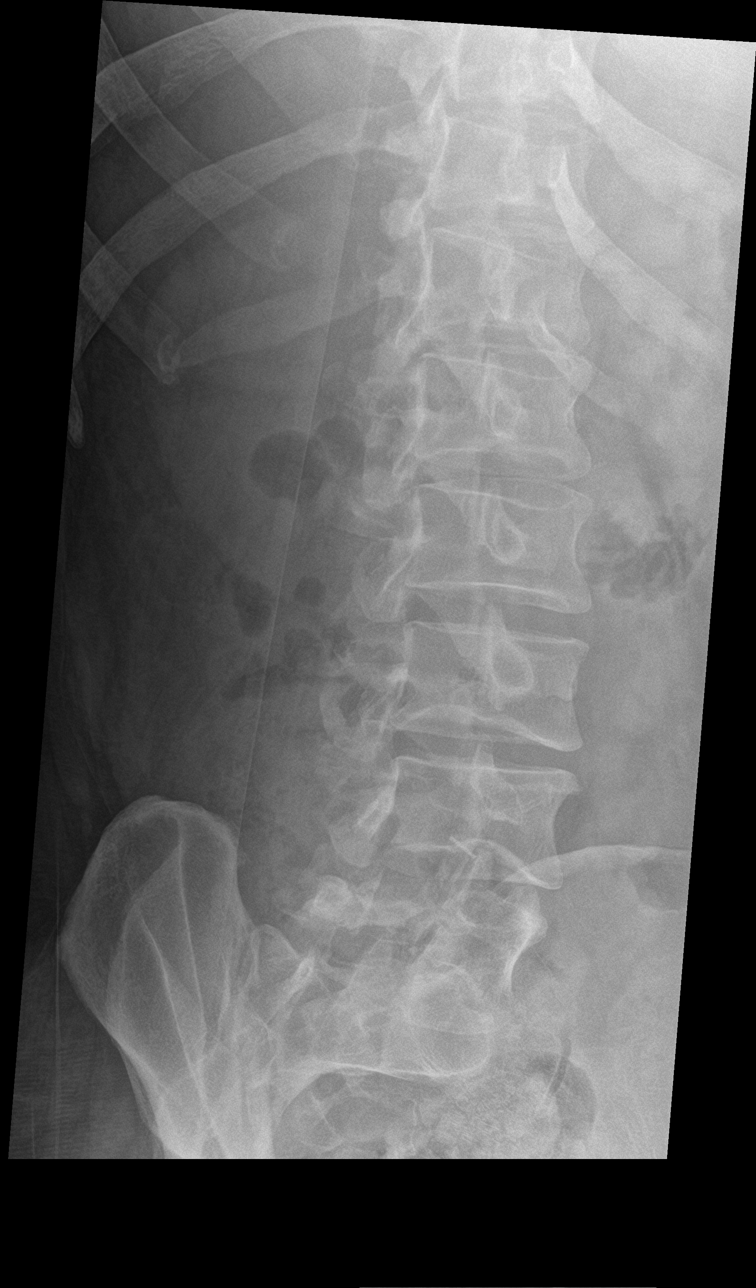

[l-spine lat]
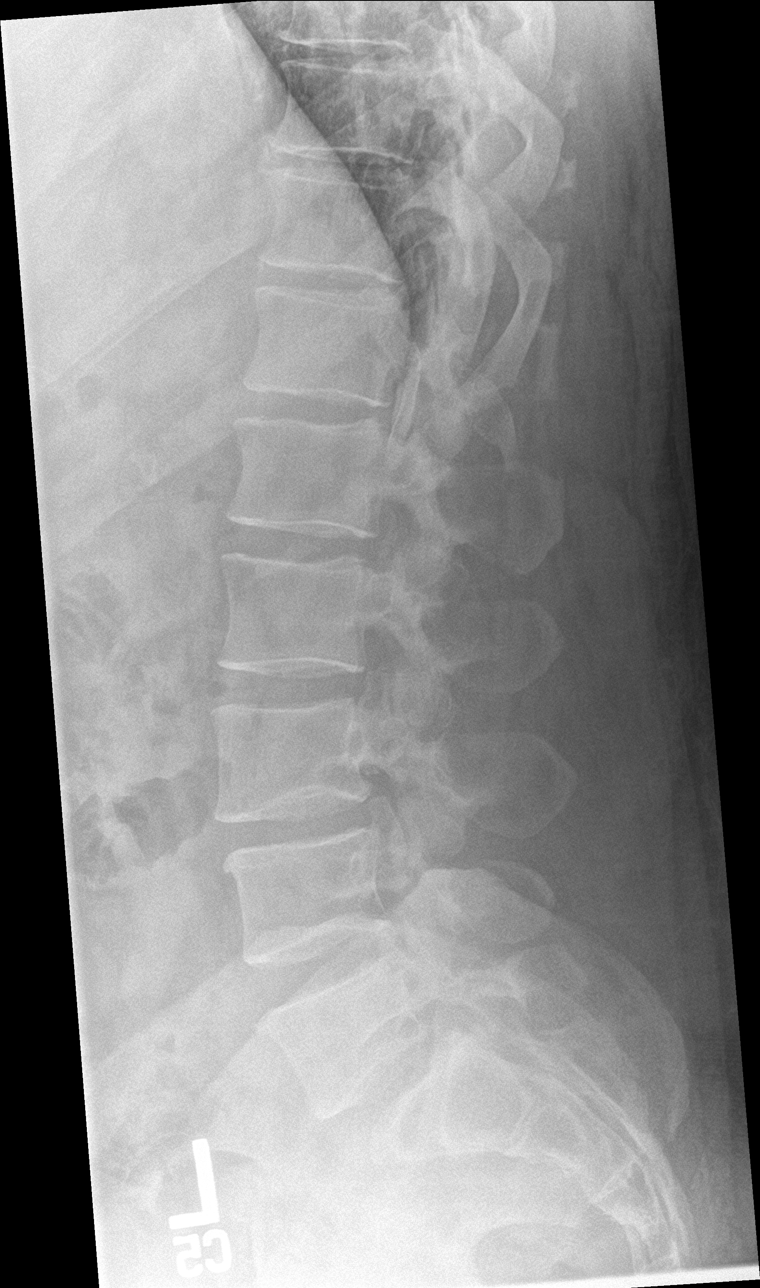

[l-spine spot]
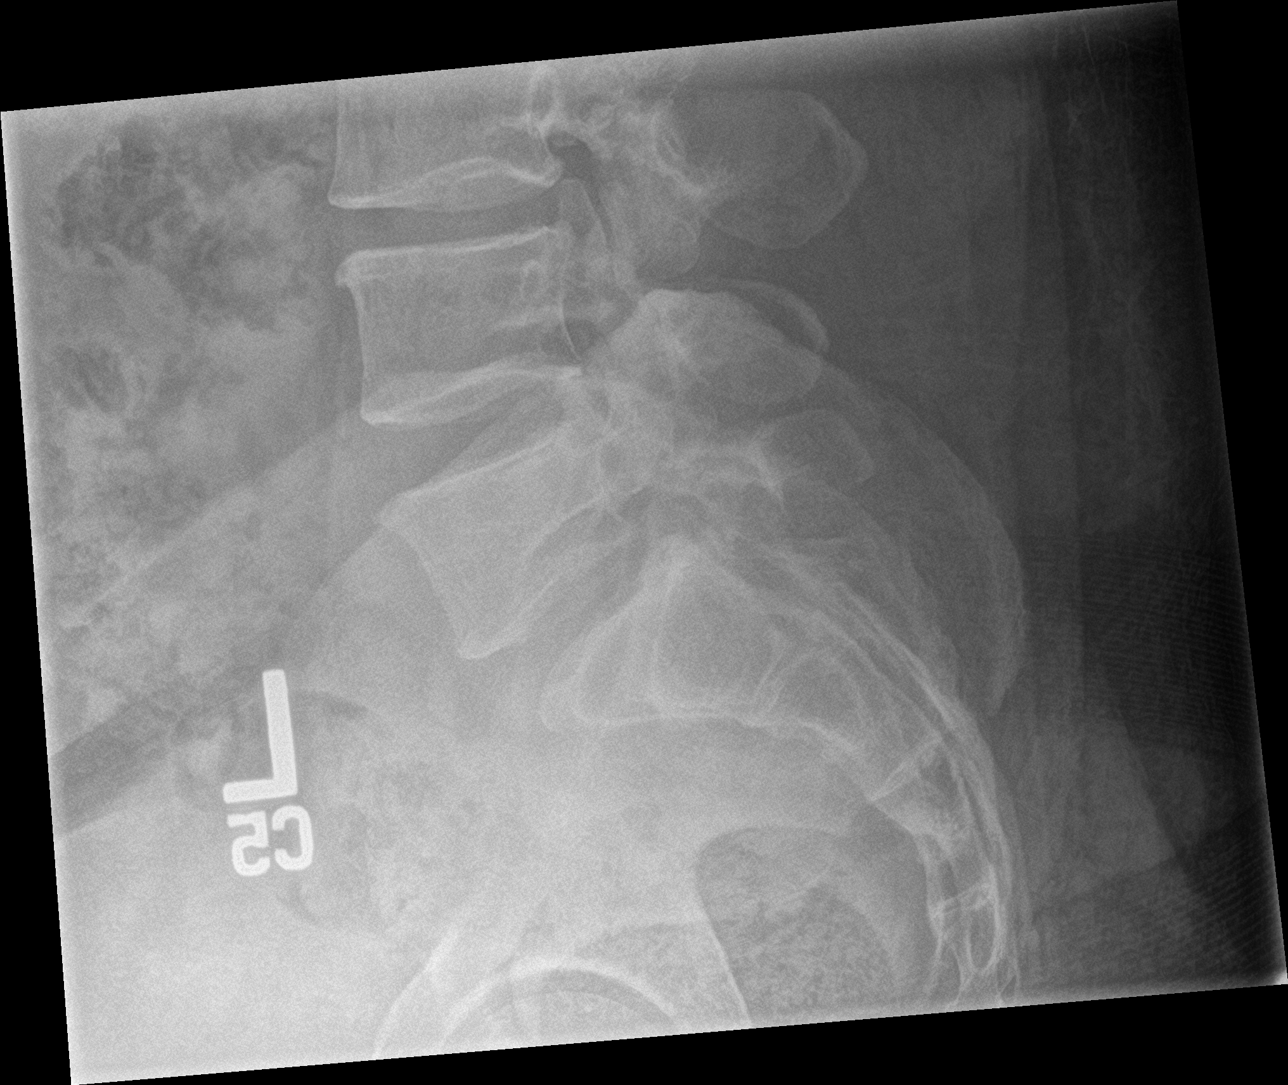

[5 of 5 positions shown; findings below may reference images not displayed]

FINDINGS: There is no evidence of acute lumbar spine fracture. Grade 1
anterolisthesis L5 on S1 with bilateral L5 pars interarticularis
defects, likely chronic. Intervertebral disc spaces are maintained.
IMPRESSION: 1. No evidence of acute lumbar spine fracture.
2. Grade 1 anterolisthesis L5 on S1 with bilateral L5 pars
interarticularis defects, likely chronic.

## 2022-03-02 ENCOUNTER — Encounter: Payer: Self-pay | Admitting: Primary Care

## 2022-03-02 ENCOUNTER — Ambulatory Visit (INDEPENDENT_AMBULATORY_CARE_PROVIDER_SITE_OTHER): Payer: BC Managed Care – PPO | Admitting: Primary Care

## 2022-03-02 VITALS — BP 150/72 | HR 86 | Temp 97.0°F | Ht 73.0 in | Wt 296.0 lb

## 2022-03-02 DIAGNOSIS — Z720 Tobacco use: Secondary | ICD-10-CM | POA: Diagnosis not present

## 2022-03-02 DIAGNOSIS — F411 Generalized anxiety disorder: Secondary | ICD-10-CM

## 2022-03-02 DIAGNOSIS — I10 Essential (primary) hypertension: Secondary | ICD-10-CM | POA: Diagnosis not present

## 2022-03-02 MED ORDER — BUPROPION HCL ER (XL) 150 MG PO TB24: 150.0000 mg | ORAL_TABLET | Freq: Every day | ORAL | 0 refills | Status: DC

## 2022-03-02 NOTE — Assessment & Plan Note (Signed)
Improved.  Will add bupropion XL 150 mg daily for smoking cessation. This may also help with anxiety.

## 2022-03-02 NOTE — Assessment & Plan Note (Addendum)
Uncontrolled.  He is adamant on improving BP with lifestyle changes first. We will continue his losartan 100 mg daily. He will work on his diet.  We discussed risk factors for heart disease/stroke including his tobacco use/hypertension/hyperlipidemia.   We will plan to see him back in 6 weeks for follow up and repeat labs.

## 2022-03-02 NOTE — Patient Instructions (Signed)
Continue to work on [employer].  Start bupropion XL 150 mg once daily in the morning for smoking/anxiety.   We will see you back in 6 weeks.  It was a pleasure to see you today!

## 2022-03-02 NOTE — Progress Notes (Signed)
Subjective:    Patient ID: Edward Day, male    DOB: 1985/10/22, 36 y.o.   MRN: [id number]  Hypertension Pertinent negatives include no chest pain, headaches or shortness of breath.    Edward Day is a very pleasant 36 y.o. male who presents today for follow up of hypertension. He would also like some help regarding tobacco use.   1) Essential Hypertension: Currently managed on losartan 100 mg daily. 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 He completed a urine test which showed protein in the urine. 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   His high BP readings frightened him so three days ago he completely changed his diet by eliminating energy drinks, switching his seasonings to low sodium, increased vegetables/greens. He is serious about improving his BP with lifestyle changes. He is compliant ot his losartan 100 mg daily.   He has a wrist BP cuff at home which is running 150's/100's. He has a family history of hypertension on both sides of the family.   2) Tobacco Abuse: He is a chronic smoker since the age of 67. He is smoking 1-1.5 PPD. He is ready to quit, but has a hard time quitting. He's tried Chantix before but this caused a bad reaction. He's tried nicotine patches and several OTC products without improvement. He has never tried Wellbutrin.   BP Readings from Last 3 Encounters:  03/02/22 (!) 150/72  09/15/21 (!) 152/92  08/03/21 (!) 148/90   Wt Readings from Last 3 Encounters:  03/02/22 296 lb (134.3 kg)  09/15/21 (!) 304 lb 4 oz (138 kg)  08/03/21 (!) 315 lb 6.4 oz (143.1 kg)      Review of Systems  Eyes:  Negative for visual disturbance.  Respiratory:  Negative for shortness of breath.   Cardiovascular:  Negative for chest pain.  Neurological:  Negative for dizziness and headaches.         Past Medical History:  Diagnosis Date   Chest pain 11/11/2013   DVT (deep venous thrombosis) (HCC)    Facial  cellulitis 03/07/2018   GAD (generalized anxiety disorder)    GERD (gastroesophageal reflux disease)    H. pylori infection    Hypertension    Low testosterone    Plant irritant contact dermatitis 02/28/2018   Polycythemia    Pulmonary embolism (HCC)     Social History   Socioeconomic History   Marital status: Married    Spouse name: Not on file   Number of children: Not on file   Years of education: Not on file   Highest education level: Not on file  Occupational History   Not on file  Tobacco Use   Smoking status: Former    Packs/day: 1.00    Years: 15.00    Total pack years: 15.00    Types: Cigarettes    Quit date: 05/24/2016    Years since quitting: 5.7   Smokeless tobacco: Former    Types: Snuff   Tobacco comments:    1 pack a day 01/23/2021  Vaping Use   Vaping Use: Never used  Substance and Sexual Activity   Alcohol use: Yes    Comment: occasionally   Drug use: No   Sexual activity: Yes    Partners: Female  Other Topics Concern   Not on file  Social History Narrative   Married.   1 child.   Works for [employer].   [place], riding motor cycles.   Social Determinants of Health   Financial Resource Strain: Not on file  Food Insecurity: Not on file  Transportation Needs: Not on file  Physical Activity: Not on file  Stress: Not on file  Social Connections: Not on file  Intimate Partner Violence: Not on file    Past Surgical History:  Procedure Laterality Date   APPENDECTOMY  2003   [place]  2014   FINGER SURGERY Right 2003   [place]  2009, 2013, 2013, 2014   TONSILLECTOMY      Family History  Problem Relation Age of Onset   Hypertension Mother    Polycythemia Mother    Hyperlipidemia Mother    Diabetes Father    Hypertension Father    Cancer Maternal Grandmother        breast cancer   Dementia Maternal Grandmother    Diabetes Maternal Grandmother     Allergies   Allergen Reactions   Banana Swelling and Anaphylaxis    Current Outpatient Medications on File Prior to Visit  Medication Sig Dispense Refill   losartan (COZAAR) 100 MG tablet TAKE 1 TABLET BY MOUTH ONCE DAILY for blood pressure. 90 tablet 3   albuterol (PROVENTIL) (2.5 MG/3ML) 0.083% nebulizer solution Take 3 mLs (2.5 mg total) by nebulization every 6 (six) hours as needed for wheezing or shortness of breath. (Patient not taking: Reported on 03/02/2022) 150 mL 1   Current Facility-Administered Medications on File Prior to Visit  Medication Dose Route Frequency Provider Last Rate Last Admin   albuterol (PROVENTIL) (2.5 MG/3ML) 0.083% nebulizer solution 2.5 mg  2.5 mg Nebulization Once Dugal, Tabitha, FNP        BP (!) 150/72   Pulse 86   Temp (!) 97 F (36.1 C) (Temporal)   Ht '6\' 1"'$  (1.854 m)   Wt 296 lb (134.3 kg)   SpO2 99%   BMI 39.05 kg/m  Objective:   Physical Exam Cardiovascular:     Rate and Rhythm: Normal rate and regular rhythm.  Pulmonary:     Effort: Pulmonary effort is normal.     Breath sounds: Normal breath sounds. No wheezing or rales.  Musculoskeletal:     Cervical back: Neck supple.  Skin:    General: Skin is warm and dry.  Neurological:     Mental Status: He is alert and oriented to person, place, and time.           Assessment & Plan:   Problem List Items Addressed This Visit       Cardiovascular and Mediastinum   Essential hypertension    Uncontrolled.  He is adamant on improving BP with lifestyle changes first. We will continue his losartan 100 mg daily. He will work on his diet.  We discussed risk factors for heart disease/stroke including his tobacco use/hypertension/hyperlipidemia.   We will plan to see him back in 6 weeks for follow up and repeat labs.        Other   GAD (generalized anxiety disorder) - Primary    Improved.  Will add bupropion XL 150 mg daily for smoking cessation. This may also help with anxiety.       Relevant Medications   buPROPion (WELLBUTRIN XL) 150 MG 24 hr tablet   Tobacco abuse    Ready to quit.  Bad effects with Chantix. Failed OTC.  Start bupropion XL 150 mg daily.  Follow up  in 6 weeks.       Relevant Medications   buPROPion (WELLBUTRIN XL) 150 MG 24 hr tablet       Pleas Koch, NP

## 2022-03-02 NOTE — Assessment & Plan Note (Signed)
Ready to quit.  Bad effects with Chantix. Failed OTC.  Start bupropion XL 150 mg daily.  Follow up in 6 weeks.

## 2022-04-13 ENCOUNTER — Encounter: Payer: Self-pay | Admitting: Primary Care

## 2022-04-13 ENCOUNTER — Ambulatory Visit: Payer: BC Managed Care – PPO | Admitting: Primary Care

## 2022-04-13 VITALS — BP 148/98 | HR 106 | Temp 97.7°F | Ht 73.0 in | Wt 274.0 lb

## 2022-04-13 DIAGNOSIS — F411 Generalized anxiety disorder: Secondary | ICD-10-CM

## 2022-04-13 DIAGNOSIS — I10 Essential (primary) hypertension: Secondary | ICD-10-CM

## 2022-04-13 DIAGNOSIS — R7303 Prediabetes: Secondary | ICD-10-CM

## 2022-04-13 DIAGNOSIS — E785 Hyperlipidemia, unspecified: Secondary | ICD-10-CM

## 2022-04-13 DIAGNOSIS — Z720 Tobacco use: Secondary | ICD-10-CM

## 2022-04-13 MED ORDER — AMLODIPINE BESYLATE 5 MG PO TABS: 5.0000 mg | ORAL_TABLET | Freq: Every day | ORAL | 0 refills | Status: DC

## 2022-04-13 NOTE — Assessment & Plan Note (Signed)
Commended him on his 20 pound weight loss, encouraged to continue!  Repeat lipids pending.

## 2022-04-13 NOTE — Assessment & Plan Note (Signed)
Improved!  Continue bupropion XL 150 mg daily.

## 2022-04-13 NOTE — Progress Notes (Signed)
Subjective:    Patient ID: Edward Day, male    DOB: 17-Aug-1985, 36 y.o.   MRN: [id number]  HPI  Edward Day is a very pleasant 36 y.o. male with a history of hypertension, GERD, GAD, shiftwork sleep disorder, tobacco abuse, prediabetes who presents today for follow-up of hypertension, tobacco abuse, and updated labs.  1) Hypertension: He was last evaluated on 03/02/2022 for hypertension follow-up.  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  His urinalysis was notable for protein in the urine during his DOT physical.  Three days prior to this visit he significantly changed his lifestyle through a healthier diet.  He requested to continue these efforts to see if he can lower his blood pressure naturally.  He is here for follow-up today.  He continues to work on his diet by using low to no sodium seasoning, eating mostly baked or grilled foods, lowering his energy drink use. He is checking BP at home which is running high 130's/80's. He is compliant to losartan 100 mg daily.  He would like to refrain from checking his urine today.  BP Readings from Last 3 Encounters:  04/13/22 (!) 148/98  03/02/22 (!) 150/72  09/15/21 (!) 152/92   Wt Readings from Last 3 Encounters:  04/13/22 274 lb (124.3 kg)  03/02/22 296 lb (134.3 kg)  09/15/21 (!) 304 lb 4 oz (138 kg)    2) Tobacco Abuse: Last evaluated on 03/02/2022, ready to quit smoking.  History of bad effects on Chantix, failed OTC products previously.  During this visit we initiated him on bupropion XL 150 mg daily.  He is here for follow-up today.  He continues to take bupropion XL 150 mg which has helped with mood and anxiety, but has increased his cravings for smoking. He continues to smoke. He would like to remain on bupropion for mood/anxiety and also as it has curbed his appetite.   3) Prediabetes: A1C of 5.8 in April 2023.  He is due  for repeat A1C level today. He continues to work on his diet, has lost 20 pounds.   4) Hyperlipidemia: LDL of 166 in April 2023. Since his last visit he's intentionally lost 20 pounds through dietary changes. He is due for repeat lipid panel today.  Review of Systems  Respiratory:  Negative for shortness of breath.   Cardiovascular:  Negative for chest pain.  Neurological:  Negative for dizziness and headaches.  Psychiatric/Behavioral:  The patient is not nervous/anxious.          Past Medical History:  Diagnosis Date   Chest pain 11/11/2013   DVT (deep venous thrombosis) (HCC)    Facial cellulitis 03/07/2018   GAD (generalized anxiety disorder)    GERD (gastroesophageal reflux disease)    H. pylori infection    Hypertension    Low testosterone    Plant irritant contact dermatitis 02/28/2018   Polycythemia    Pulmonary embolism ([place])     Social History   Socioeconomic History   Marital status: Married    Spouse name: Not on file   Number of children: Not on file   Years of education: Not on file   Highest education level: Not on file  Occupational History   Not on file  Tobacco Use   Smoking status: Former    Packs/day: 1.00    Years: 15.00    Total pack years: 15.00  Types: Cigarettes    Quit date: 05/24/2016    Years since quitting: 5.8   Smokeless tobacco: Former    Types: Snuff   Tobacco comments:    1 pack a day 01/23/2021  Vaping Use   Vaping Use: Never used  Substance and Sexual Activity   Alcohol use: Yes    Comment: occasionally   Drug use: No   Sexual activity: Yes    Partners: Female  Other Topics Concern   Not on file  Social History Narrative   Married.   1 child.   Works for [employer].   Enjoys going to the lake, riding motor cycles.   Social Determinants of Health   Financial Resource Strain: Not on file  Food Insecurity: Not on file  Transportation Needs: Not on file  Physical Activity: Not on file   Stress: Not on file  Social Connections: Not on file  Intimate Partner Violence: Not on file    Past Surgical History:  Procedure Laterality Date   APPENDECTOMY  2003   [place]  2014   FINGER SURGERY Right 2003   [place]  2009, 2013, 2013, 2014   TONSILLECTOMY      Family History  Problem Relation Age of Onset   Hypertension Mother    Polycythemia Mother    Hyperlipidemia Mother    Diabetes Father    Hypertension Father    Cancer Maternal Grandmother        breast cancer   Dementia Maternal Grandmother    Diabetes Maternal Grandmother     Allergies  Allergen Reactions   Banana Swelling and Anaphylaxis    Current Outpatient Medications on File Prior to Visit  Medication Sig Dispense Refill   buPROPion (WELLBUTRIN XL) 150 MG 24 hr tablet Take 1 tablet (150 mg total) by mouth daily. 90 tablet 0   losartan (COZAAR) 100 MG tablet TAKE 1 TABLET BY MOUTH ONCE DAILY for blood pressure. 90 tablet 3   albuterol (PROVENTIL) (2.5 MG/3ML) 0.083% nebulizer solution Take 3 mLs (2.5 mg total) by nebulization every 6 (six) hours as needed for wheezing or shortness of breath. (Patient not taking: Reported on 04/13/2022) 150 mL 1   Current Facility-Administered Medications on File Prior to Visit  Medication Dose Route Frequency Provider Last Rate Last Admin   albuterol (PROVENTIL) (2.5 MG/3ML) 0.083% nebulizer solution 2.5 mg  2.5 mg Nebulization Once Dugal, Tabitha, FNP        BP (!) 148/98   Pulse (!) 106   Temp 97.7 F (36.5 C) (Temporal)   Ht '6\' 1"'$  (1.854 m)   Wt 274 lb (124.3 kg)   SpO2 97%   BMI 36.15 kg/m  Objective:   Physical Exam Cardiovascular:     Rate and Rhythm: Normal rate and regular rhythm.  Pulmonary:     Effort: Pulmonary effort is normal.     Breath sounds: Normal breath sounds. No wheezing or rales.  Musculoskeletal:     Cervical back: Neck supple.  Skin:    General: Skin is warm and dry.  Neurological:     Mental  Status: He is alert and oriented to person, place, and time.  Psychiatric:        Mood and Affect: Mood normal.           Assessment & Plan:   Problem List Items Addressed This Visit       Cardiovascular and Mediastinum   Essential hypertension -  Primary    Improved with diastolic reading, home readings better too.  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  Continue losartan 100 mg daily. Add amlodipine 5 mg daily.  Continue to work on weight loss efforts!  Follow up in 3 months. He will continue to monitor BP at home.      Relevant Medications   amLODipine (NORVASC) 5 MG tablet     Other   GAD (generalized anxiety disorder)    Improved!  Continue bupropion XL 150 mg daily.      Hyperlipidemia    Commended him on his 20 pound weight loss, encouraged to continue!  Repeat lipids pending.      Relevant Medications   amLODipine (NORVASC) 5 MG tablet   Other Relevant Orders   Lipid panel   Tobacco abuse    No improvement.  Continue bupropion XL 150 mg for now.      Prediabetes    Commended him on weight loss through diet. Repeat A1C pending.      Relevant Orders   Hemoglobin A1c       Pleas Koch, NP

## 2022-04-13 NOTE — Assessment & Plan Note (Signed)
Improved with diastolic reading, home readings better too.  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  Continue losartan 100 mg daily. Add amlodipine 5 mg daily.  Continue to work on weight loss efforts!  Follow up in 3 months. He will continue to monitor BP at home.

## 2022-04-13 NOTE — Patient Instructions (Signed)
Stop by the lab prior to leaving today. I will notify you of your results once received.   Start amlodipine 5 mg once daily for blood pressure.  Continue taking losartan 100 mg daily for blood pressure.  Schedule a follow up visit as discussed.  Keep up the great work!  It was a pleasure to see you today!

## 2022-04-13 NOTE — Assessment & Plan Note (Signed)
No improvement.  Continue bupropion XL 150 mg for now.

## 2022-04-13 NOTE — Assessment & Plan Note (Signed)
Commended him on weight loss through diet. Repeat A1C pending.

## 2022-04-14 LAB — LIPID PANEL
Cholesterol: 256 mg/dL — ABNORMAL HIGH (ref <200)
HDL: 67 mg/dL (ref >=40)
LDL Cholesterol (Calc): 163 mg/dL (calc) — ABNORMAL HIGH
Non-HDL Cholesterol (Calc): 189 mg/dL (calc) — ABNORMAL HIGH (ref <130)
Total CHOL/HDL Ratio: 3.8 (calc) (ref <5.0)
Triglycerides: 131 mg/dL (ref <150)

## 2022-04-14 LAB — HEMOGLOBIN A1C
Hgb A1c MFr Bld: 5.4 % of total Hgb (ref <5.7)
Mean Plasma Glucose: 108 mg/dL
eAG (mmol/L): 6 mmol/L

## 2022-05-14 ENCOUNTER — Other Ambulatory Visit: Payer: Self-pay | Admitting: Primary Care

## 2022-05-14 DIAGNOSIS — Z720 Tobacco use: Secondary | ICD-10-CM

## 2022-05-14 DIAGNOSIS — I10 Essential (primary) hypertension: Secondary | ICD-10-CM

## 2022-05-14 DIAGNOSIS — F411 Generalized anxiety disorder: Secondary | ICD-10-CM

## 2022-08-23 ENCOUNTER — Other Ambulatory Visit: Payer: Self-pay | Admitting: Primary Care

## 2022-08-23 DIAGNOSIS — I10 Essential (primary) hypertension: Secondary | ICD-10-CM

## 2022-10-23 ENCOUNTER — Other Ambulatory Visit: Payer: Self-pay | Admitting: Primary Care

## 2022-10-23 DIAGNOSIS — Z720 Tobacco use: Secondary | ICD-10-CM

## 2022-10-23 DIAGNOSIS — F411 Generalized anxiety disorder: Secondary | ICD-10-CM

## 2023-01-04 ENCOUNTER — Ambulatory Visit (INDEPENDENT_AMBULATORY_CARE_PROVIDER_SITE_OTHER): Payer: BC Managed Care – PPO | Admitting: Family

## 2023-01-04 ENCOUNTER — Encounter: Payer: Self-pay | Admitting: Family

## 2023-01-04 VITALS — BP 136/84 | HR 95 | Temp 97.1°F | Ht 73.0 in | Wt 312.0 lb

## 2023-01-04 DIAGNOSIS — M5417 Radiculopathy, lumbosacral region: Secondary | ICD-10-CM | POA: Diagnosis not present

## 2023-01-04 DIAGNOSIS — R7303 Prediabetes: Secondary | ICD-10-CM

## 2023-01-04 MED ORDER — IBUPROFEN 800 MG PO TABS: 800.0000 mg | ORAL_TABLET | Freq: Three times a day (TID) | ORAL | 2 refills | Status: AC | PRN

## 2023-01-04 MED ORDER — PREDNISONE 20 MG PO TABS: 20.0000 mg | ORAL_TABLET | Freq: Every day | ORAL | 0 refills | Status: DC

## 2023-01-04 NOTE — Progress Notes (Signed)
Acute Office Visit  Subjective:     Patient ID: Edward Day, male    DOB: Dec 21, 1985, 37 y.o.   MRN: [id number]  Chief Complaint  Patient presents with   Back Pain    Started a few weeks ago  Lower back pain, going down into right leg. Right leg goes numb    HPI Patient is in today with concerns of low back pain that radiates down his right leg with numbness.  Reports that he has had low back pain for multiple years.  It appears to be getting worse now.  Pain is about a 8 out of 10 at the end of the day.  He denies any previous injury.  He would like to be treated today but declines imaging at this point due to financial constraints.  Denies any urinary issues.  Review of Systems  Constitutional: Negative.   Respiratory: Negative.    Cardiovascular: Negative.   Musculoskeletal:  Positive for back pain.  Neurological:  Positive for tingling and weakness.       Pain radiating down the right leg with numbness  Endo/Heme/Allergies: Negative.   Psychiatric/Behavioral: Negative.    All other systems reviewed and are negative.  Past Medical History:  Diagnosis Date   Chest pain 11/11/2013   DVT (deep venous thrombosis) (HCC)    Facial cellulitis 03/07/2018   GAD (generalized anxiety disorder)    GERD (gastroesophageal reflux disease)    H. pylori infection    Hypertension    Low testosterone    Plant irritant contact dermatitis 02/28/2018   Polycythemia    Pulmonary embolism (HCC)     Social History   Socioeconomic History   Marital status: Married    Spouse name: Not on file   Number of children: Not on file   Years of education: Not on file   Highest education level: Not on file  Occupational History   Not on file  Tobacco Use   Smoking status: Former    Current packs/day: 0.00    Average packs/day: 1 pack/day for 15.0 years (15.0 ttl pk-yrs)    Types: Cigarettes    Start date: 05/24/2001    Quit date: 05/24/2016    Years since quitting: 6.6   Smokeless  tobacco: Former    Types: Snuff   Tobacco comments:    1 pack a day 01/23/2021  Vaping Use   Vaping status: Never Used  Substance and Sexual Activity   Alcohol use: Yes    Comment: occasionally   Drug use: No   Sexual activity: Yes    Partners: Female  Other Topics Concern   Not on file  Social History Narrative   Married.   1 child.   Works for [employer].   [place]jo[place] lake, riding motor cycles.   Social Determinants of Health   Financial Resource Strain: Not on file  Food Insecurity: Not on file  Transportation Needs: Not on file  Physical Activity: Not on file  Stress: Not on file  Social Connections: Not on file  Intimate Partner Violence: Not on file    Past Surgical History:  Procedure Laterality Date   APPENDECTOMY  2003   ELBOW SURGERY  2014   FINGER SURGERY Right 2003   KNEE SURGERY     SHOULDER SURGERY  2009, 2013, 2013, 2014   TONSILLECTOMY      Family History  Problem Relation Age of Onset   Hypertension Mother    Polycythemia  Mother    Hyperlipidemia Mother    Diabetes Father    Hypertension Father    Cancer Maternal Grandmother        breast cancer   Dementia Maternal Grandmother    Diabetes Maternal Grandmother     Allergies  Allergen Reactions   Banana Swelling and Anaphylaxis    Current Outpatient Medications on File Prior to Visit  Medication Sig Dispense Refill   buPROPion (WELLBUTRIN XL) 150 MG 24 hr tablet TAKE ONE TABLET BY MOUTH DAILY 90 tablet 0   losartan (COZAAR) 100 MG tablet TAKE ONE TABLET BY MOUTH ONCE DAILY FOR BLOOD PRESSURE 90 tablet 1   albuterol (PROVENTIL) (2.5 MG/3ML) 0.083% nebulizer solution Take 3 mLs (2.5 mg total) by nebulization every 6 (six) hours as needed for wheezing or shortness of breath. (Patient not taking: Reported on 04/13/2022) 150 mL 1   amLODipine (NORVASC) 5 MG tablet Take 1 tablet (5 mg total) by mouth daily. for blood pressure. (Patient not taking: Reported on  01/04/2023) 90 tablet 0   Current Facility-Administered Medications on File Prior to Visit  Medication Dose Route Frequency Provider Last Rate Last Admin   albuterol (PROVENTIL) (2.5 MG/3ML) 0.083% nebulizer solution 2.5 mg  2.5 mg Nebulization Once Dugal, Tabitha, FNP        BP 136/84   Pulse 95   Temp (!) 97.1 F (36.2 C) (Temporal)   Ht 6\' 1"  (1.854 m)   Wt (!) 312 lb (141.5 kg)   SpO2 97%   BMI 41.16 kg/m chart      Objective:    BP 136/84   Pulse 95   Temp (!) 97.1 F (36.2 C) (Temporal)   Ht 6\' 1"  (1.854 m)   Wt (!) 312 lb (141.5 kg)   SpO2 97%   BMI 41.16 kg/m    Physical Exam Vitals and nursing note reviewed.  Constitutional:      Appearance: Normal appearance. He is obese.  Cardiovascular:     Rate and Rhythm: Normal rate and regular rhythm.  Pulmonary:     Effort: Pulmonary effort is normal.     Breath sounds: Normal breath sounds.  Musculoskeletal:     Comments: Low back pain: Negative straight leg raise.  Pain elicited with rotation of the spine.  Pain also elicited when he tilts his torso to the right.  Pain elicited with rotation to the left  Skin:    General: Skin is warm and dry.  Neurological:     General: No focal deficit present.     Mental Status: He is alert and oriented to person, place, and time. Mental status is at baseline.     Gait: Gait abnormal.  Psychiatric:        Mood and Affect: Mood normal.        Behavior: Behavior normal.        Thought Content: Thought content normal.     No results found for any visits on 01/04/23.      Assessment & Plan:   Problem List Items Addressed This Visit     Prediabetes   Relevant Orders   Comprehensive metabolic panel   Hemoglobin A1c   Other Visit Diagnoses     Lumbosacral radiculopathy    -  Primary   Morbid obesity (HCC)       Relevant Orders   Comprehensive metabolic panel   Hemoglobin A1c       Meds ordered this encounter  Medications   predniSONE (DELTASONE) 20 MG  tablet    Sig: Take 1 tablet (20 mg total) by mouth daily with breakfast.    Dispense:  18 tablet    Refill:  0    60 mg po qam x 3 days, 40 mg po qam x 3 days, 20 mg po qam x 3 days   ibuprofen (ADVIL) 800 MG tablet    Sig: Take 1 tablet (800 mg total) by mouth every 8 (eight) hours as needed.    Dispense:  60 tablet    Refill:  2   Declines imaging today.  Will treat with prednisone for 9 days.  Thereafter can take ibuprofen as needed with food.  Patient also concerned about glucose levels and would like his blood sugar checked.  He is interested in Ozempic if available to him.  Call the office if symptoms worsen or persist.  Recheck as scheduled and sooner as needed. No follow-ups on file.  Eulis Foster, FNP

## 2023-01-05 LAB — COMPREHENSIVE METABOLIC PANEL
AG Ratio: 1.4 (calc) (ref 1.0–2.5)
ALT: 28 U/L (ref 9–46)
AST: 23 U/L (ref 10–40)
Albumin: 4.2 g/dL (ref 3.6–5.1)
Alkaline phosphatase (APISO): 78 U/L (ref 36–130)
BUN: 15 mg/dL (ref 7–25)
CO2: 30 mmol/L (ref 20–32)
Calcium: 8.7 mg/dL (ref 8.6–10.3)
Chloride: 102 mmol/L (ref 98–110)
Creat: 0.89 mg/dL (ref 0.60–1.26)
Globulin: 2.9 g/dL (calc) (ref 1.9–3.7)
Glucose, Bld: 79 mg/dL (ref 65–99)
Potassium: 4.2 mmol/L (ref 3.5–5.3)
Sodium: 142 mmol/L (ref 135–146)
Total Bilirubin: 0.3 mg/dL (ref 0.2–1.2)
Total Protein: 7.1 g/dL (ref 6.1–8.1)

## 2023-01-05 LAB — HEMOGLOBIN A1C
Hgb A1c MFr Bld: 5.6 % of total Hgb (ref <5.7)
Mean Plasma Glucose: 114 mg/dL
eAG (mmol/L): 6.3 mmol/L

## 2023-02-01 ENCOUNTER — Ambulatory Visit
Admission: RE | Admit: 2023-02-01 | Discharge: 2023-02-01 | Disposition: A | Discharge status: Home / Self Care | Payer: BC Managed Care – PPO | Source: Ambulatory Visit | Attending: Primary Care | Admitting: Primary Care

## 2023-02-01 ENCOUNTER — Encounter: Payer: Self-pay | Admitting: Primary Care

## 2023-02-01 ENCOUNTER — Ambulatory Visit (INDEPENDENT_AMBULATORY_CARE_PROVIDER_SITE_OTHER): Payer: BC Managed Care – PPO | Admitting: Primary Care

## 2023-02-01 VITALS — BP 156/94 | HR 84 | Temp 98.1°F | Ht 73.0 in | Wt 295.0 lb

## 2023-02-01 DIAGNOSIS — R1084 Generalized abdominal pain: Secondary | ICD-10-CM | POA: Insufficient documentation

## 2023-02-01 DIAGNOSIS — Z6838 Body mass index (BMI) 38.0-38.9, adult: Secondary | ICD-10-CM | POA: Diagnosis not present

## 2023-02-01 DIAGNOSIS — M2550 Pain in unspecified joint: Secondary | ICD-10-CM

## 2023-02-01 DIAGNOSIS — M25561 Pain in right knee: Secondary | ICD-10-CM

## 2023-02-01 DIAGNOSIS — K429 Umbilical hernia without obstruction or gangrene: Secondary | ICD-10-CM | POA: Diagnosis not present

## 2023-02-01 DIAGNOSIS — E66812 Obesity, class 2: Secondary | ICD-10-CM | POA: Diagnosis not present

## 2023-02-01 DIAGNOSIS — R109 Unspecified abdominal pain: Secondary | ICD-10-CM | POA: Diagnosis not present

## 2023-02-01 DIAGNOSIS — K5730 Diverticulosis of large intestine without perforation or abscess without bleeding: Secondary | ICD-10-CM | POA: Diagnosis not present

## 2023-02-01 DIAGNOSIS — G8929 Other chronic pain: Secondary | ICD-10-CM | POA: Diagnosis not present

## 2023-02-01 DIAGNOSIS — K760 Fatty (change of) liver, not elsewhere classified: Secondary | ICD-10-CM | POA: Diagnosis not present

## 2023-02-01 LAB — COMPREHENSIVE METABOLIC PANEL
ALT: 30 U/L (ref 0–53)
AST: 21 U/L (ref 0–37)
Albumin: 3.9 g/dL (ref 3.5–5.2)
Alkaline Phosphatase: 75 U/L (ref 39–117)
BUN: 11 mg/dL (ref 6–23)
CO2: 31 meq/L (ref 19–32)
Calcium: 9.5 mg/dL (ref 8.4–10.5)
Chloride: 103 meq/L (ref 96–112)
Creatinine, Ser: 0.67 mg/dL (ref 0.40–1.50)
GFR: 118.38 mL/min (ref >60.00)
Glucose, Bld: 95 mg/dL (ref 70–99)
Potassium: 4.3 meq/L (ref 3.5–5.1)
Sodium: 142 meq/L (ref 135–145)
Total Bilirubin: 0.3 mg/dL (ref 0.2–1.2)
Total Protein: 6.6 g/dL (ref 6.0–8.3)

## 2023-02-01 LAB — CBC WITH DIFFERENTIAL/PLATELET
Basophils Absolute: 0 10*3/uL (ref 0.0–0.1)
Basophils Relative: 0.4 % (ref 0.0–3.0)
Eosinophils Absolute: 0.1 10*3/uL (ref 0.0–0.7)
Eosinophils Relative: 1.1 % (ref 0.0–5.0)
HCT: 52.2 % — ABNORMAL HIGH (ref 39.0–52.0)
Hemoglobin: 17.3 g/dL — ABNORMAL HIGH (ref 13.0–17.0)
Lymphocytes Relative: 23.5 % (ref 12.0–46.0)
Lymphs Abs: 1.7 10*3/uL (ref 0.7–4.0)
MCHC: 33.1 g/dL (ref 30.0–36.0)
MCV: 98.5 fL (ref 78.0–100.0)
Monocytes Absolute: 0.4 10*3/uL (ref 0.1–1.0)
Monocytes Relative: 6.4 % (ref 3.0–12.0)
Neutro Abs: 4.8 10*3/uL (ref 1.4–7.7)
Neutrophils Relative %: 68.6 % (ref 43.0–77.0)
Platelets: 250 10*3/uL (ref 150.0–400.0)
RBC: 5.3 Mil/uL (ref 4.22–5.81)
RDW: 13.4 % (ref 11.5–15.5)
WBC: 7 10*3/uL (ref 4.0–10.5)

## 2023-02-01 LAB — SEDIMENTATION RATE: Sed Rate: 26 mm/h — ABNORMAL HIGH (ref 0–15)

## 2023-02-01 LAB — C-REACTIVE PROTEIN: CRP: <1 mg/dL (ref 0.5–20.0)

## 2023-02-01 MED ORDER — TIRZEPATIDE-WEIGHT MANAGEMENT 2.5 MG/0.5ML ~~LOC~~ SOLN
2.5000 mg | SUBCUTANEOUS | 0 refills | Status: DC
Start: 2023-02-01 — End: 2024-02-06

## 2023-02-01 MED ORDER — IOHEXOL 300 MG/ML  SOLN
100.0000 mL | Freq: Once | INTRAMUSCULAR | Status: AC | PRN
  Administered 2023-02-01: 100 mL via INTRAVENOUS

## 2023-02-01 NOTE — Patient Instructions (Signed)
Stop by the lab prior to leaving today. I will notify you of your results once received.   You will receive a phone call today regarding the CT scan.  Start tirzepitide (Zepbound) for weight loss. Start by injecting 2.5 mg into the skin once weekly for 4 weeks, then increase to 5 mg once weekly thereafter. Please notify me once you've used your last 2.5 mg pen so that I can prescribe the next dose.   It was a pleasure to see you today!

## 2023-02-01 NOTE — Assessment & Plan Note (Signed)
Exam today overall stable. Suspect ligamental strain.   Fortunately, he is improving.  Continue with conservative treatment.  Consider imaging if needed.

## 2023-02-01 NOTE — Progress Notes (Signed)
Subjective:    Patient ID: KELLEY LEAMING, male    DOB: 1985-05-09, 37 y.o.   MRN: [id number]  Abdominal Pain Associated symptoms include arthralgias and diarrhea. Pertinent negatives include no nausea or vomiting.  Knee Pain     SHIRLY MORESI is a very pleasant 37 y.o. male with a history of hypertension, pulmonary embolism, GERD, GAD, tobacco use, prediabetes who presents today to discuss multiple concerns.  His wife joins [place] today.  1) Abdominal Pain: His abdominal pain is located to the right umbilical region which began about 2-3 weeks ago for which he describes as cramping and pulling which sometimes radiates to the back. His pain has been constant, intermittent today but he has been less active. His pain is worse with coughing. He denies pain with eating and drinking.   He's also noticed right lower quadrant abdominal pain which began about 6-12 months ago. His pain is intermittent for which he describes as sharp.  A CT scan of his abdomen and pelvis was ordered years ago for which he was never able to complete due to cost.  He works for an [employer], is on the line, does a lot climbing, lifting, squatting, heavy lifting.  He has been working 18+ hours a day for the last several weeks.  He denies nausea, vomiting, increased diarrhea, chills.  He does have chronic diarrhea which has been present for years.  His wife suspects he has lactose intolerance.  2) Acute Knee Pain: His knee pain is located to the right lateral knee which occurred 2-3 weeks ago after stepping out of his work truck landing into a ditch. His pain is worse when standing after sitting for a long period of time. He's been taking Ibuprofen 800 mg daily for years for his chronic joint pain.  Today his knee pain is feeling better.  He plans to give it a few days to see if it will resolve.  3) Class 2 Obesity: Chronic for years.  His weight has fluctuated throughout the years with various diets.  Typically  though his weight always returns.  He would like to try weight loss medications. He is interested in trying Zepbound.   Wt Readings from Last 3 Encounters:  02/01/23 295 lb (133.8 kg)  01/04/23 (!) 312 lb (141.5 kg)  04/13/22 274 lb (124.3 kg)     Body mass index is 38.92 kg/m.  4) Chronic Joint Pain: Chronic to multiple joints of entire body for years.  Chronic low back pain, chronic knee pain, chronic neck pain.  History of knee surgeries.  He has never been tested for rheumatoid arthritis.  He takes ibuprofen or Aleve every day, has been doing this for years.  Review of Systems  Respiratory:  Negative for shortness of breath.   Cardiovascular:  Negative for chest pain.  Gastrointestinal:  Positive for abdominal pain and diarrhea. Negative for abdominal distention, blood in stool, nausea and vomiting.  Musculoskeletal:  Positive for arthralgias, back pain and neck pain.         Past Medical History:  Diagnosis Date   Acute neck pain 10/18/2020   Chest pain 11/11/2013   DVT (deep venous thrombosis) (HCC)    Facial cellulitis 03/07/2018   GAD (generalized anxiety disorder)    GERD (gastroesophageal reflux disease)    H. pylori infection    Hypertension    Low testosterone    Plant irritant contact dermatitis 02/28/2018   Polycythemia    Pulmonary embolism (HCC)  Pulmonary embolism (HCC) 11/23/2016    Social History   Socioeconomic History   Marital status: Married    Spouse name: Not on file   Number of children: Not on file   Years of education: Not on file   Highest education level: Not on file  Occupational History   Not on file  Tobacco Use   Smoking status: Former    Current packs/day: 0.00    Average packs/day: 1 pack/day for 15.0 years (15.0 ttl pk-yrs)    Types: Cigarettes    Start date: 05/24/2001    Quit date: 05/24/2016    Years since quitting: 6.6   Smokeless tobacco: Former    Types: Snuff   Tobacco comments:    1 pack a day 01/23/2021  Vaping  Use   Vaping status: Never Used  Substance and Sexual Activity   Alcohol use: Yes    Comment: occasionally   Drug use: No   Sexual activity: Yes    Partners: Female  Other Topics Concern   Not on file  Social History Narrative   Married.   1 child.   Works for [employer].   Enjo[place] lake, riding motor cycles.   Social Determinants of Health   Financial Resource Strain: Not on file  Food Insecurity: Not on file  Transportation Needs: Not on file  Physical Activity: Not on file  Stress: Not on file  Social Connections: Not on file  Intimate Partner Violence: Not on file    Past Surgical History:  Procedure Laterality Date   APPENDECTOMY  2003   ELBOW SURGERY  2014   FINGER SURGERY Right 2003   KNEE SURGERY     SHOULDER SURGERY  2009, 2013, 2013, 2014   TONSILLECTOMY      Family History  Problem Relation Age of Onset   Hypertension Mother    Polycythemia Mother    Hyperlipidemia Mother    Diabetes Father    Hypertension Father    Cancer Maternal Grandmother        breast cancer   Dementia Maternal Grandmother    Diabetes Maternal Grandmother     Allergies  Allergen Reactions   Banana Swelling and Anaphylaxis    Current Outpatient Medications on File Prior to Visit  Medication Sig Dispense Refill   albuterol (PROVENTIL) (2.5 MG/3ML) 0.083% nebulizer solution Take 3 mLs (2.5 mg total) by nebulization every 6 (six) hours as needed for wheezing or shortness of breath. 150 mL 1   buPROPion (WELLBUTRIN XL) 150 MG 24 hr tablet TAKE ONE TABLET BY MOUTH DAILY 90 tablet 0   ibuprofen (ADVIL) 800 MG tablet Take 1 tablet (800 mg total) by mouth every 8 (eight) hours as needed. 60 tablet 2   losartan (COZAAR) 100 MG tablet TAKE ONE TABLET BY MOUTH ONCE DAILY FOR BLOOD PRESSURE 90 tablet 1   amLODipine (NORVASC) 5 MG tablet Take 1 tablet (5 mg total) by mouth daily. for blood pressure. (Patient not taking: Reported on 02/01/2023) 90  tablet 0   Current Facility-Administered Medications on File Prior to Visit  Medication Dose Route Frequency Provider Last Rate Last Admin   albuterol (PROVENTIL) (2.5 MG/3ML) 0.083% nebulizer solution 2.5 mg  2.5 mg Nebulization Once Dugal, Tabitha, FNP        BP (!) 156/94   Pulse 84   Temp 98.1 F (36.7 C) (Temporal)   Ht 6\' 1"  (1.854 m)   Wt 295 lb (133.8 kg)   SpO2 97%  BMI 38.92 kg/m  Objective:   Physical Exam Constitutional:      Appearance: He is not ill-appearing.  Cardiovascular:     Rate and Rhythm: Normal rate and regular rhythm.  Pulmonary:     Effort: Pulmonary effort is normal.     Breath sounds: Normal breath sounds.  Abdominal:     General: Bowel sounds are normal.     Palpations: Abdomen is soft.     Tenderness: There is no abdominal tenderness.  Musculoskeletal:     Cervical back: Neck supple.     Right knee: No swelling or erythema. Normal range of motion. Tenderness present over the LCL. No LCL laxity.       Legs:  Skin:    General: Skin is warm and dry.  Neurological:     Mental Status: He is alert and oriented to person, place, and time.  Psychiatric:        Mood and Affect: Mood normal.           Assessment & Plan:  Abdominal pain, generalized Assessment & Plan: Symptoms suggestive of hernia, especially given line of work.  Stat CT abdomen/pelvis ordered and pending. CMP and CBC ordered and pending.  Food allergy panel pending for chronic diarrhea.  Orders: -     CT ABDOMEN PELVIS W CONTRAST; Future -     Comprehensive metabolic panel -     CBC with Differential/Platelet -     Food Allergy Profile  Class 2 severe obesity due to excess calories with serious comorbidity and body mass index (BMI) of 38.0 to 38.9 in adult [facility]) Assessment & Plan: Agree to do initiate GLP-1 agonist treatment as he clearly qualifies.  Start tirzepitide (Zepbound) for weight loss. Start by injecting 2.5 mg into the skin once weekly for 4 weeks,  then increase to 5 mg once weekly thereafter.   Follow up in 3 months.  Orders: -     Tirzepatide-Weight Management; Inject 2.5 mg into the skin once a week.  Dispense: 2 mL; Refill: 0  Chronic joint pain Assessment & Plan: Checking labs today to rule out autoimmune arthritis.  Sed rate, CRP, CCP, RF pending.  Orders: -     Cyclic citrul peptide antibody, IgG -     C-reactive protein -     Sedimentation rate -     Rheumatoid factor  Acute pain of right knee Assessment & Plan: Exam today overall stable. Suspect ligamental strain.   Fortunately, he is improving.  Continue with conservative treatment.  Consider imaging if needed.         Doreene Nest, NP

## 2023-02-01 NOTE — Assessment & Plan Note (Signed)
Agree to do initiate GLP-1 agonist treatment as he clearly qualifies.  Start tirzepitide (Zepbound) for weight loss. Start by injecting 2.5 mg into the skin once weekly for 4 weeks, then increase to 5 mg once weekly thereafter.   Follow up in 3 months.

## 2023-02-01 NOTE — Assessment & Plan Note (Signed)
Checking labs today to rule out autoimmune arthritis.  Sed rate, CRP, CCP, RF pending.

## 2023-02-01 NOTE — Assessment & Plan Note (Signed)
Symptoms suggestive of hernia, especially given line of work.  Stat CT abdomen/pelvis ordered and pending. CMP and CBC ordered and pending.  Food allergy panel pending for chronic diarrhea.

## 2023-02-02 LAB — RHEUMATOID FACTOR: Rheumatoid fact SerPl-aCnc: 15 [IU]/mL — ABNORMAL HIGH (ref <14)

## 2023-02-03 DIAGNOSIS — G8929 Other chronic pain: Secondary | ICD-10-CM

## 2023-02-03 DIAGNOSIS — R768 Other specified abnormal immunological findings in serum: Secondary | ICD-10-CM

## 2023-02-05 LAB — FOOD ALLERGY PROFILE
Allergen, Salmon, f41: <0.1 kU/L
Almonds: 1.66 kU/L — ABNORMAL HIGH
CLASS: 0
CLASS: 0
CLASS: 0
CLASS: 2
CLASS: 2
CLASS: 2
CLASS: 2
CLASS: 2
CLASS: 2
CLASS: 2
CLASS: 2
Cashew IgE: 1.27 kU/L — ABNORMAL HIGH
Class: 0
Class: 0
Class: 1
Class: 2
Egg White IgE: <0.1 kU/L
Fish Cod: <0.1 kU/L
Hazelnut: 1.57 kU/L — ABNORMAL HIGH
Milk IgE: <0.1 kU/L
Peanut IgE: 2.01 kU/L — ABNORMAL HIGH
Scallop IgE: 1.36 kU/L — ABNORMAL HIGH
Sesame Seed f10: 1.97 kU/L — ABNORMAL HIGH
Shrimp IgE: 0.64 kU/L — ABNORMAL HIGH
Soybean IgE: 1.38 kU/L — ABNORMAL HIGH
Tuna IgE: <0.1 kU/L
Walnut: 1.64 kU/L — ABNORMAL HIGH
Wheat IgE: 1.99 kU/L — ABNORMAL HIGH

## 2023-02-05 LAB — INTERPRETATION:

## 2023-02-05 LAB — CYCLIC CITRUL PEPTIDE ANTIBODY, IGG: Cyclic Citrullin Peptide Ab: <16 U

## 2023-02-06 ENCOUNTER — Encounter: Payer: Self-pay | Admitting: *Deleted

## 2023-02-18 ENCOUNTER — Telehealth: Payer: Self-pay | Admitting: Pharmacist

## 2023-02-18 NOTE — Telephone Encounter (Signed)
Pharmacy Patient Advocate Encounter   Received notification from Physician's Office that prior authorization for Zepbound 2.5MG /0.5ML pen-injectors is required/requested.   Insurance verification completed.   The patient is insured through [employer] .   Per test claim: PA required; PA submitted to above mentioned insurance via CoverMyMeds Key/confirmation #/EOC [id number] Status is pending

## 2023-02-18 NOTE — Telephone Encounter (Signed)
Pharmacy Patient Advocate Encounter  Received notification from [facility] that Prior Authorization for Zepbound 2.5MG /0.5ML pen-injectors has been DENIED.  See denial reason below. No denial letter attached in CMM. Will attach denial letter to Media tab once received.   PA #/Case ID/Reference #: [id number]   Denied due to the medication is not covered by the plan.

## 2023-02-18 NOTE — Telephone Encounter (Signed)
Noted, see MyChart message.

## 2023-03-05 ENCOUNTER — Other Ambulatory Visit: Payer: Self-pay | Admitting: Primary Care

## 2023-03-05 DIAGNOSIS — I10 Essential (primary) hypertension: Secondary | ICD-10-CM

## 2023-04-07 DIAGNOSIS — M7521 Bicipital tendinitis, right shoulder: Secondary | ICD-10-CM | POA: Diagnosis not present

## 2023-04-16 DIAGNOSIS — M069 Rheumatoid arthritis, unspecified: Secondary | ICD-10-CM | POA: Diagnosis not present

## 2023-04-16 DIAGNOSIS — Z79899 Other long term (current) drug therapy: Secondary | ICD-10-CM | POA: Diagnosis not present

## 2023-04-16 DIAGNOSIS — M0579 Rheumatoid arthritis with rheumatoid factor of multiple sites without organ or systems involvement: Secondary | ICD-10-CM | POA: Diagnosis not present

## 2023-04-16 DIAGNOSIS — F1010 Alcohol abuse, uncomplicated: Secondary | ICD-10-CM | POA: Diagnosis not present

## 2023-04-16 DIAGNOSIS — Z1159 Encounter for screening for other viral diseases: Secondary | ICD-10-CM | POA: Diagnosis not present

## 2023-04-20 DIAGNOSIS — S60221A Contusion of right hand, initial encounter: Secondary | ICD-10-CM | POA: Diagnosis not present

## 2023-05-06 DIAGNOSIS — S62344A Nondisplaced fracture of base of fourth metacarpal bone, right hand, initial encounter for closed fracture: Secondary | ICD-10-CM | POA: Diagnosis not present

## 2023-05-31 ENCOUNTER — Other Ambulatory Visit: Payer: Self-pay | Admitting: Primary Care

## 2023-05-31 DIAGNOSIS — I10 Essential (primary) hypertension: Secondary | ICD-10-CM

## 2023-07-18 DIAGNOSIS — Z720 Tobacco use: Secondary | ICD-10-CM

## 2023-07-18 MED ORDER — ALBUTEROL SULFATE HFA 108 (90 BASE) MCG/ACT IN AERS: 2.0000 | INHALATION_SPRAY | Freq: Four times a day (QID) | RESPIRATORY_TRACT | 0 refills | Status: DC | PRN

## 2023-09-04 ENCOUNTER — Other Ambulatory Visit: Payer: Self-pay | Admitting: Primary Care

## 2023-09-04 DIAGNOSIS — I10 Essential (primary) hypertension: Secondary | ICD-10-CM

## 2023-09-09 DIAGNOSIS — S93692A Other sprain of left foot, initial encounter: Secondary | ICD-10-CM | POA: Diagnosis not present

## 2023-09-24 DIAGNOSIS — S93692A Other sprain of left foot, initial encounter: Secondary | ICD-10-CM | POA: Diagnosis not present

## 2023-10-08 DIAGNOSIS — S93692A Other sprain of left foot, initial encounter: Secondary | ICD-10-CM | POA: Diagnosis not present

## 2023-11-01 ENCOUNTER — Encounter: Payer: Self-pay | Admitting: Advanced Practice Midwife

## 2023-12-21 ENCOUNTER — Other Ambulatory Visit: Payer: Self-pay | Admitting: Primary Care

## 2023-12-21 DIAGNOSIS — I10 Essential (primary) hypertension: Secondary | ICD-10-CM

## 2023-12-21 NOTE — Telephone Encounter (Signed)
Patient is due for CPE/follow up in late October, this will be required prior to any further refills.  Please schedule, thank you!

## 2023-12-30 DIAGNOSIS — H00024 Hordeolum internum left upper eyelid: Secondary | ICD-10-CM | POA: Diagnosis not present

## 2024-02-06 ENCOUNTER — Ambulatory Visit: Admitting: Primary Care

## 2024-02-06 ENCOUNTER — Ambulatory Visit: Payer: Self-pay | Admitting: Primary Care

## 2024-02-06 ENCOUNTER — Encounter: Payer: Self-pay | Admitting: Primary Care

## 2024-02-06 VITALS — BP 158/96 | HR 86 | Temp 97.5°F | Ht 73.0 in | Wt 325.0 lb

## 2024-02-06 DIAGNOSIS — F411 Generalized anxiety disorder: Secondary | ICD-10-CM | POA: Diagnosis not present

## 2024-02-06 DIAGNOSIS — Z0001 Encounter for general adult medical examination with abnormal findings: Secondary | ICD-10-CM | POA: Diagnosis not present

## 2024-02-06 DIAGNOSIS — R051 Acute cough: Secondary | ICD-10-CM | POA: Diagnosis not present

## 2024-02-06 DIAGNOSIS — K219 Gastro-esophageal reflux disease without esophagitis: Secondary | ICD-10-CM

## 2024-02-06 DIAGNOSIS — G8929 Other chronic pain: Secondary | ICD-10-CM

## 2024-02-06 DIAGNOSIS — E785 Hyperlipidemia, unspecified: Secondary | ICD-10-CM | POA: Diagnosis not present

## 2024-02-06 DIAGNOSIS — I10 Essential (primary) hypertension: Secondary | ICD-10-CM

## 2024-02-06 DIAGNOSIS — M2550 Pain in unspecified joint: Secondary | ICD-10-CM

## 2024-02-06 DIAGNOSIS — R7303 Prediabetes: Secondary | ICD-10-CM

## 2024-02-06 LAB — COMPREHENSIVE METABOLIC PANEL WITH GFR
ALT: 44 U/L (ref 0–53)
AST: 34 U/L (ref 0–37)
Albumin: 3.9 g/dL (ref 3.5–5.2)
Alkaline Phosphatase: 70 U/L (ref 39–117)
BUN: 13 mg/dL (ref 6–23)
CO2: 30 meq/L (ref 19–32)
Calcium: 8.7 mg/dL (ref 8.4–10.5)
Chloride: 102 meq/L (ref 96–112)
Creatinine, Ser: 0.6 mg/dL (ref 0.40–1.50)
GFR: 121.52 mL/min (ref >60.00)
Glucose, Bld: 107 mg/dL — ABNORMAL HIGH (ref 70–99)
Potassium: 4.2 meq/L (ref 3.5–5.1)
Sodium: 141 meq/L (ref 135–145)
Total Bilirubin: 0.6 mg/dL (ref 0.2–1.2)
Total Protein: 6.5 g/dL (ref 6.0–8.3)

## 2024-02-06 LAB — LIPID PANEL
Cholesterol: 240 mg/dL — ABNORMAL HIGH (ref 0–200)
HDL: 45 mg/dL (ref >39.00)
LDL Cholesterol: 160 mg/dL — ABNORMAL HIGH (ref 0–99)
NonHDL: 194.9
Total CHOL/HDL Ratio: 5
Triglycerides: 173 mg/dL — ABNORMAL HIGH (ref 0.0–149.0)
VLDL: 34.6 mg/dL (ref 0.0–40.0)

## 2024-02-06 LAB — POC COVID19 BINAXNOW: SARS Coronavirus 2 Ag: NEGATIVE

## 2024-02-06 LAB — HEMOGLOBIN A1C: Hgb A1c MFr Bld: 6 % (ref 4.6–6.5)

## 2024-02-06 MED ORDER — ALBUTEROL SULFATE HFA 108 (90 BASE) MCG/ACT IN AERS: 2.0000 | INHALATION_SPRAY | RESPIRATORY_TRACT | 0 refills | Status: AC | PRN

## 2024-02-06 MED ORDER — PREDNISONE 20 MG PO TABS: ORAL_TABLET | ORAL | 0 refills | Status: AC

## 2024-02-06 NOTE — Assessment & Plan Note (Signed)
 Controllable.  No concerns today.

## 2024-02-06 NOTE — Assessment & Plan Note (Signed)
 Evaluated by rheumatology. Reviewed office notes from December 2024 through Care Everywhere.

## 2024-02-06 NOTE — Assessment & Plan Note (Signed)
 Repeat A1c pending

## 2024-02-06 NOTE — Progress Notes (Signed)
 Subjective:    Patient ID: Edward Day, male    DOB: 01-May-1985, 38 y.o.   MRN: [id number]  Edward Day is a very pleasant 38 y.o. male who presents today for complete physical and follow up of chronic conditions.  He would also like to discuss acute cough. Symptom onset yesterday with sinus drainage, chest congestion, cough. He's not taken anything for his symptoms.  He has started to experience chest tightness with difficulty coughing and congestion.  He denies fevers, chills, body aches.  Several of his coworkers have come down with the same symptoms.   Immunizations: -Tetanus: Completed in 2023 -Influenza: Declines influenza vaccine.   Diet: Fair diet.  Exercise: No regular exercise.  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 He's checked his BP at home a few times which is about the same.     Review of Systems  Constitutional:  Negative for unexpected weight change.  HENT:  Positive for congestion. Negative for rhinorrhea.   Respiratory:  Positive for cough and chest tightness.   Cardiovascular:  Negative for chest pain.  Gastrointestinal:  Negative for constipation and diarrhea.  Genitourinary:  Negative for difficulty urinating.  Musculoskeletal:  Positive for arthralgias. Negative for myalgias.       Chronic left elbow pain.   Skin:  Negative for rash.       Scaly rash to left upper extremity x 1 month. Non itchy.  Allergic/Immunologic: Negative for environmental allergies.  Neurological:  Negative for dizziness and headaches.  Psychiatric/Behavioral:  The patient is not nervous/anxious.          Past Medical History:  Diagnosis Date   Acute neck pain 10/18/2020   Chest pain 11/11/2013   DVT (deep venous thrombosis) (HCC)    Facial cellulitis 03/07/2018   GAD (generalized  anxiety disorder)    GERD (gastroesophageal reflux disease)    H. pylori infection    Hypertension    Low testosterone     Plant irritant contact dermatitis 02/28/2018   Polycythemia    Pulmonary embolism (HCC)    Pulmonary embolism (HCC) 11/23/2016    Social History   Socioeconomic History   Marital status: Married    Spouse name: Not on file   Number of children: Not on file   Years of education: Not on file   Highest education level: Associate degree: academic program  Occupational History   Not on file  Tobacco Use   Smoking status: Every Day    Current packs/day: 0.00    Average packs/day: 1 pack/day for 15.0 years (15.0 ttl pk-yrs)    Types: Cigarettes    Start date: 05/24/2001    Last attempt to quit: 05/24/2016    Years since quitting: 7.7   Smokeless tobacco: Former    Types: Snuff   Tobacco comments:    1 pack a day 01/23/2021  Vaping Use   Vaping status: Never Used  Substance and Sexual Activity   Alcohol use: Yes    Comment: occasionally   Drug use: No   Sexual activity: Yes    Partners: Female  Other Topics Concern   Not on file  Social History Narrative   Married.   1 child.   Works for [employer].   Enjo[place] lake, riding  motor cycles.   Social Drivers of Corporate investment banker Strain: Low Risk  (02/04/2024)   Overall Financial Resource Strain (CARDIA)    Difficulty of Paying Living Expenses: Not hard at all  Food Insecurity: No Food Insecurity (02/04/2024)   Hunger Vital Sign    Worried About Running Out of Food in the Last Year: Never true    Ran Out of Food in the Last Year: Never true  Transportation Needs: No Transportation Needs (02/04/2024)   PRAPARE - Administrator, Civil Service (Medical): No    Lack of Transportation (Non-Medical): No  Physical Activity: Sufficiently Active (02/04/2024)   Exercise Vital Sign    Days of Exercise per Week: 7 days    Minutes of Exercise per Session:  150+ min  Stress: Stress Concern Present (02/04/2024)   [employer] of Occupational Health - Occupational Stress Questionnaire    Feeling of Stress: To some extent  Social Connections: Socially Integrated (02/04/2024)   Social Connection and Isolation Panel    Frequency of Communication with Friends and Family: More than three times a week    Frequency of Social Gatherings with Friends and Family: Twice a week    Attends Religious Services: More than 4 times per year    Active Member of Clubs or Organizations: Yes    Attends Banker Meetings: 1 to 4 times per year    Marital Status: Married  Catering manager Violence: Not on file    Past Surgical History:  Procedure Laterality Date   APPENDECTOMY  2003   ELBOW SURGERY  2014   FINGER SURGERY Right 2003   KNEE SURGERY     SHOULDER SURGERY  2009, 2013, 2013, 2014   TONSILLECTOMY      Family History  Problem Relation Age of Onset   Hypertension Mother    Polycythemia Mother    Hyperlipidemia Mother    Diabetes Father    Hypertension Father    Cancer Maternal Grandmother        breast cancer   Dementia Maternal Grandmother    Diabetes Maternal Grandmother     Allergies  Allergen Reactions   Banana Swelling and Anaphylaxis    Current Outpatient Medications on File Prior to Visit  Medication Sig Dispense Refill   ibuprofen  (ADVIL ) 800 MG tablet Take 1 tablet (800 mg total) by mouth every 8 (eight) hours as needed. 60 tablet 2   losartan  (COZAAR ) 100 MG tablet TAKE ONE TABLET BY MOUTH ONCE DAILY FOR BLOOD PRESSURE 90 tablet 0   Current Facility-Administered Medications on File Prior to Visit  Medication Dose Route Frequency Provider Last Rate Last Admin   albuterol  (PROVENTIL ) (2.5 MG/3ML) 0.083% nebulizer solution 2.5 mg  2.5 mg Nebulization Once Dugal, Tabitha, FNP        BP (!) 158/96   Pulse 86   Temp (!) 97.5 F (36.4 C) (Temporal)   Ht 6' 1 (1.854 m)   Wt (!) 325 lb (147.4 kg)   SpO2 95%    BMI 42.88 kg/m  Objective:   Physical Exam HENT:     Right Ear: Tympanic membrane and ear canal normal.     Left Ear: Tympanic membrane and ear canal normal.  Eyes:     Pupils: Pupils are equal, round, and reactive to light.  Cardiovascular:     Rate and Rhythm: Normal rate and regular rhythm.  Pulmonary:     Effort: Pulmonary effort is normal.     Breath sounds:  Examination of the right-upper field reveals rhonchi. Examination of the left-upper field reveals rhonchi. Examination of the right-lower field reveals rhonchi. Examination of the left-lower field reveals rhonchi. Rhonchi present. No wheezing.     Comments: Chronic smoker. Congested cough noted during visit. Abdominal:     General: Bowel sounds are normal.     Palpations: Abdomen is soft.     Tenderness: There is no abdominal tenderness.  Musculoskeletal:        General: Normal range of motion.     Cervical back: Neck supple.  Skin:    General: Skin is warm and dry.  Neurological:     Mental Status: He is alert and oriented to person, place, and time.     Cranial Nerves: No cranial nerve deficit.     Deep Tendon Reflexes:     Reflex Scores:      Patellar reflexes are 2+ on the right side and 2+ on the left side. Psychiatric:        Mood and Affect: Mood normal.     Physical Exam        Assessment & Plan:  Encounter for annual general medical examination with abnormal findings in adult Assessment & Plan: Declines influenza vaccine. Tetanus UTD  Discussed the importance of a healthy diet and regular exercise in order for weight loss, and to reduce the risk of further co-morbidity.  Exam stable. Labs pending.  Follow up in 1 year for repeat physical. If negative looks   Gastroesophageal reflux disease, unspecified whether esophagitis present Assessment & Plan: Stable.  Continue omeprazole 20 mg daily.   GAD (generalized anxiety disorder) Assessment & Plan: Controllable.  No concerns  today.    Acute cough Assessment & Plan: Symptoms suggestive of viral etiology at this point. Rapid COVID-19 test negative.  Given his smoking history and HPI, we will treat with steroid course.  Start prednisone  20 mg tablets. Take 2 tablets by mouth once daily in the morning for 5 days. Prescription for albuterol  inhaler sent to pharmacy.   Return precautions provided.  Orders: -     predniSONE ; Take 2 tablets by mouth once daily in the morning for 5 days.  Dispense: 10 tablet; Refill: 0 -     Albuterol  Sulfate HFA; Inhale 2 puffs into the lungs every 4 (four) hours as needed for shortness of breath.  Dispense: 1 each; Refill: 0 -     POC COVID-19 BinaxNow  Essential hypertension Assessment & Plan: Above goal today, home readings are at goal.  Continue losartan  100 mg daily. CMP pending.  He will continue to monitor blood pressure readings at home.   Prediabetes Assessment & Plan: Repeat A1c pending.  Orders: -     Hemoglobin A1c  Hyperlipidemia, unspecified hyperlipidemia type Assessment & Plan: Repeat lipid panel pending.  Orders: -     Comprehensive metabolic panel with GFR -     Lipid panel  Chronic joint pain Assessment & Plan: Evaluated by rheumatology. Reviewed office notes from December 2024 through [facility] Everywhere.     Assessment and Plan Assessment & Plan         Comer MARLA Gaskins, NP    History of Present Illness

## 2024-02-06 NOTE — Assessment & Plan Note (Signed)
 Stable.  Continue omeprazole 20mg  daily

## 2024-02-06 NOTE — Assessment & Plan Note (Signed)
 Repeat lipid panel pending.

## 2024-02-06 NOTE — Assessment & Plan Note (Signed)
 Above goal today, home readings are at goal.  Continue losartan  100 mg daily. CMP pending.  He will continue to monitor blood pressure readings at home.

## 2024-02-06 NOTE — Assessment & Plan Note (Signed)
 Symptoms suggestive of viral etiology at this point. Rapid COVID-19 test negative.  Given his smoking history and HPI, we will treat with steroid course.  Start prednisone  20 mg tablets. Take 2 tablets by mouth once daily in the morning for 5 days. Prescription for albuterol  inhaler sent to pharmacy.   Return precautions provided.

## 2024-02-06 NOTE — Assessment & Plan Note (Signed)
 Declines influenza vaccine. Tetanus UTD  Discussed the importance of a healthy diet and regular exercise in order for weight loss, and to reduce the risk of further co-morbidity.  Exam stable. Labs pending.  Follow up in 1 year for repeat physical. If negative looks

## 2024-02-06 NOTE — Patient Instructions (Addendum)
 Stop by the lab prior to leaving today. I will notify you of your results once received.   Start prednisone  20 mg tablets. Take 2 tablets by mouth once daily in the morning for 5 days.  Shortness of Breath/Wheezing/Cough: Use the albuterol  inhaler. Inhale 2 puffs into the lungs every 4 to 6 hours as needed for wheezing, cough, and/or shortness of breath.   It was a pleasure to see you today!

## 2024-03-23 ENCOUNTER — Other Ambulatory Visit: Payer: Self-pay | Admitting: Primary Care

## 2024-03-23 DIAGNOSIS — I10 Essential (primary) hypertension: Secondary | ICD-10-CM
